# Patient Record
Sex: Female | Born: 1960 | Race: White | Hispanic: No | Marital: Single | Smoking: Current every day smoker
Health system: Southern US, Community
[De-identification: ages and names within clinical notes are randomized; demographics above are authoritative.]

## PROBLEM LIST (undated history)

## (undated) DIAGNOSIS — E039 Hypothyroidism, unspecified: Secondary | ICD-10-CM

## (undated) DIAGNOSIS — G479 Sleep disorder, unspecified: Secondary | ICD-10-CM

## (undated) DIAGNOSIS — M797 Fibromyalgia: Secondary | ICD-10-CM

## (undated) DIAGNOSIS — I700 Atherosclerosis of aorta: Secondary | ICD-10-CM

## (undated) DIAGNOSIS — M8580 Other specified disorders of bone density and structure, unspecified site: Secondary | ICD-10-CM

## (undated) DIAGNOSIS — J449 Chronic obstructive pulmonary disease, unspecified: Secondary | ICD-10-CM

## (undated) HISTORY — DX: Chronic obstructive pulmonary disease, unspecified: J44.9

## (undated) HISTORY — DX: Fibromyalgia: M79.7

## (undated) HISTORY — DX: Atherosclerosis of aorta: I70.0

## (undated) HISTORY — DX: Hypothyroidism, unspecified: E03.9

## (undated) HISTORY — DX: Sleep disorder, unspecified: G47.9

## (undated) HISTORY — DX: Other specified disorders of bone density and structure, unspecified site: M85.80

---

## 1979-08-03 HISTORY — PX: LAPAROSCOPY: SHX197

## 1990-12-02 HISTORY — PX: THYROIDECTOMY, PARTIAL: SHX18

## 2002-05-24 ENCOUNTER — Other Ambulatory Visit: Admission: RE | Admit: 2002-05-24 | Discharge: 2002-05-24 | Payer: Self-pay | Admitting: Obstetrics & Gynecology

## 2003-07-25 ENCOUNTER — Other Ambulatory Visit: Admission: RE | Admit: 2003-07-25 | Discharge: 2003-07-25 | Payer: Self-pay | Admitting: Obstetrics & Gynecology

## 2004-09-17 ENCOUNTER — Other Ambulatory Visit: Admission: RE | Admit: 2004-09-17 | Discharge: 2004-09-17 | Payer: Self-pay | Admitting: Obstetrics & Gynecology

## 2005-02-12 ENCOUNTER — Ambulatory Visit: Payer: Self-pay | Admitting: Internal Medicine

## 2005-04-02 ENCOUNTER — Ambulatory Visit: Payer: Self-pay | Admitting: Internal Medicine

## 2005-07-01 ENCOUNTER — Ambulatory Visit: Payer: Self-pay | Admitting: Internal Medicine

## 2005-07-22 ENCOUNTER — Ambulatory Visit: Payer: Self-pay | Admitting: Internal Medicine

## 2005-10-08 ENCOUNTER — Ambulatory Visit: Payer: Self-pay | Admitting: Internal Medicine

## 2005-12-11 ENCOUNTER — Ambulatory Visit: Payer: Self-pay | Admitting: Internal Medicine

## 2006-02-27 ENCOUNTER — Ambulatory Visit: Payer: Self-pay | Admitting: Internal Medicine

## 2006-08-07 ENCOUNTER — Ambulatory Visit: Payer: Self-pay | Admitting: Internal Medicine

## 2007-02-05 ENCOUNTER — Ambulatory Visit: Payer: Self-pay | Admitting: Internal Medicine

## 2007-05-27 ENCOUNTER — Encounter: Admission: RE | Admit: 2007-05-27 | Discharge: 2007-05-27 | Payer: Self-pay | Admitting: Obstetrics & Gynecology

## 2007-08-05 DIAGNOSIS — G479 Sleep disorder, unspecified: Secondary | ICD-10-CM | POA: Insufficient documentation

## 2007-08-05 DIAGNOSIS — J309 Allergic rhinitis, unspecified: Secondary | ICD-10-CM | POA: Insufficient documentation

## 2007-08-05 DIAGNOSIS — N809 Endometriosis, unspecified: Secondary | ICD-10-CM | POA: Insufficient documentation

## 2007-08-05 DIAGNOSIS — M797 Fibromyalgia: Secondary | ICD-10-CM | POA: Insufficient documentation

## 2007-08-05 DIAGNOSIS — E039 Hypothyroidism, unspecified: Secondary | ICD-10-CM

## 2007-08-17 ENCOUNTER — Ambulatory Visit: Payer: Self-pay | Admitting: Internal Medicine

## 2007-08-17 LAB — CONVERTED CEMR LAB
Blood in Urine, dipstick: NEGATIVE
Glucose, Urine, Semiquant: NEGATIVE
Ketones, urine, test strip: NEGATIVE
Nitrite: NEGATIVE
Protein, U semiquant: NEGATIVE
WBC Urine, dipstick: NEGATIVE

## 2007-08-19 LAB — CONVERTED CEMR LAB
Albumin: 4 g/dL (ref 3.5–5.2)
BUN: 8 mg/dL (ref 6–23)
Calcium: 9.1 mg/dL (ref 8.4–10.5)
Chloride: 109 meq/L (ref 96–112)
Creatinine, Ser: 0.6 mg/dL (ref 0.4–1.2)
Free T4: 0.9 ng/dL (ref 0.6–1.6)
GFR calc Af Amer: 138 mL/min
GFR calc non Af Amer: 114 mL/min
Sodium: 144 meq/L (ref 135–145)
TSH: 0.74 microintl units/mL (ref 0.35–5.50)

## 2007-11-10 ENCOUNTER — Ambulatory Visit: Payer: Self-pay | Admitting: Internal Medicine

## 2007-11-24 ENCOUNTER — Telehealth (INDEPENDENT_AMBULATORY_CARE_PROVIDER_SITE_OTHER): Payer: Self-pay | Admitting: *Deleted

## 2007-12-17 ENCOUNTER — Ambulatory Visit: Payer: Self-pay | Admitting: Family Medicine

## 2007-12-17 LAB — CONVERTED CEMR LAB
Ketones, urine, test strip: NEGATIVE
Nitrite: NEGATIVE
Specific Gravity, Urine: 1.01
Urobilinogen, UA: NEGATIVE
WBC Urine, dipstick: NEGATIVE
pH: 6.5

## 2008-02-12 ENCOUNTER — Ambulatory Visit: Payer: Self-pay | Admitting: Internal Medicine

## 2008-04-28 ENCOUNTER — Telehealth: Payer: Self-pay | Admitting: Internal Medicine

## 2008-05-17 ENCOUNTER — Ambulatory Visit: Payer: Self-pay | Admitting: Internal Medicine

## 2008-05-17 LAB — CONVERTED CEMR LAB
Bilirubin Urine: NEGATIVE
Specific Gravity, Urine: <1.005
Urobilinogen, UA: 0.2
WBC, UA: 0 cells/hpf

## 2008-08-22 ENCOUNTER — Ambulatory Visit: Payer: Self-pay | Admitting: Internal Medicine

## 2008-08-22 DIAGNOSIS — M899 Disorder of bone, unspecified: Secondary | ICD-10-CM | POA: Insufficient documentation

## 2008-08-22 DIAGNOSIS — M949 Disorder of cartilage, unspecified: Secondary | ICD-10-CM

## 2008-08-24 LAB — CONVERTED CEMR LAB
Albumin: 4.1 g/dL (ref 3.5–5.2)
BUN: 11 mg/dL (ref 6–23)
CO2: 28 meq/L (ref 19–32)
Free T4: 1 ng/dL (ref 0.6–1.6)
GFR calc non Af Amer: 113 mL/min
Glucose, Bld: 72 mg/dL (ref 70–99)
Potassium: 3.8 meq/L (ref 3.5–5.1)
Sodium: 141 meq/L (ref 135–145)

## 2008-08-29 ENCOUNTER — Telehealth: Payer: Self-pay | Admitting: Internal Medicine

## 2008-12-29 ENCOUNTER — Telehealth: Payer: Self-pay | Admitting: Internal Medicine

## 2009-02-23 ENCOUNTER — Ambulatory Visit: Payer: Self-pay | Admitting: Internal Medicine

## 2009-04-11 ENCOUNTER — Telehealth: Payer: Self-pay | Admitting: Internal Medicine

## 2009-04-11 ENCOUNTER — Encounter: Payer: Self-pay | Admitting: Internal Medicine

## 2009-04-11 ENCOUNTER — Emergency Department (HOSPITAL_COMMUNITY): Admission: EM | Admit: 2009-04-11 | Discharge: 2009-04-11 | Payer: Self-pay | Admitting: Family Medicine

## 2009-08-25 ENCOUNTER — Ambulatory Visit: Payer: Self-pay | Admitting: Internal Medicine

## 2010-02-26 ENCOUNTER — Ambulatory Visit: Payer: Self-pay | Admitting: Internal Medicine

## 2010-03-19 ENCOUNTER — Telehealth: Payer: Self-pay | Admitting: Internal Medicine

## 2010-03-19 ENCOUNTER — Ambulatory Visit: Payer: Self-pay | Admitting: Internal Medicine

## 2010-04-18 ENCOUNTER — Ambulatory Visit: Payer: Self-pay | Admitting: Internal Medicine

## 2010-04-18 DIAGNOSIS — G44209 Tension-type headache, unspecified, not intractable: Secondary | ICD-10-CM | POA: Insufficient documentation

## 2010-05-24 ENCOUNTER — Ambulatory Visit: Payer: Self-pay | Admitting: Internal Medicine

## 2010-08-23 ENCOUNTER — Ambulatory Visit: Payer: Self-pay | Admitting: Internal Medicine

## 2010-12-23 ENCOUNTER — Encounter: Payer: Self-pay | Admitting: Obstetrics & Gynecology

## 2010-12-30 LAB — CONVERTED CEMR LAB
ALT: 16 units/L (ref 0–35)
Alkaline Phosphatase: 48 units/L (ref 39–117)
Basophils Relative: 0 % (ref 0.0–3.0)
Bilirubin, Direct: 0.1 mg/dL (ref 0.0–0.3)
Eosinophils Absolute: 0.2 10*3/uL (ref 0.0–0.7)
Eosinophils Relative: 2.2 % (ref 0.0–5.0)
GFR calc non Af Amer: 112.25 mL/min (ref >60)
Glucose, Bld: 83 mg/dL (ref 70–99)
Lymphocytes Relative: 31.9 % (ref 12.0–46.0)
Monocytes Relative: 4.6 % (ref 3.0–12.0)
Neutrophils Relative %: 61.3 % (ref 43.0–77.0)
Phosphorus: 4.4 mg/dL (ref 2.3–4.6)
Potassium: 4.2 meq/L (ref 3.5–5.1)
RBC: 4.58 M/uL (ref 3.87–5.11)
Sodium: 142 meq/L (ref 135–145)
Total Protein: 6.8 g/dL (ref 6.0–8.3)
WBC: 7.6 10*3/uL (ref 4.5–10.5)

## 2011-01-01 NOTE — Progress Notes (Signed)
  Phone Note From Other Clinic   Caller: [facility] Call For: [facility] Summary of Call: ifob test never received, test canceled. Initial call taken by: Mills Koller,  March 19, 2010 3:00 PM  Follow-up for Phone Call        noted Follow-up by: Cindee Salt MD,  March 19, 2010 5:14 PM
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
## 2011-01-01 NOTE — Letter (Signed)
Summary: [facility]: Immunoassay Fecal Occult Blood (iFOB) Order Form  [place] [facility]  [address] [place], [place] [address]   Phone: [phone]  Fax: [phone]      [facility]: Immunoassay Fecal Occult Blood (iFOB) Order Form   February 26, 2010 MRN: [id number]   ORELIA BRANDSTETTER [birth year]   Physicican Name:_______[place]__________________  Diagnosis Code:________V76.51__________________      Cindee Salt MD
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
## 2011-01-10 ENCOUNTER — Encounter: Payer: Self-pay | Admitting: Internal Medicine

## 2011-02-28 ENCOUNTER — Ambulatory Visit (INDEPENDENT_AMBULATORY_CARE_PROVIDER_SITE_OTHER): Payer: PRIVATE HEALTH INSURANCE | Admitting: Internal Medicine

## 2011-02-28 ENCOUNTER — Other Ambulatory Visit: Payer: Self-pay | Admitting: Internal Medicine

## 2011-02-28 ENCOUNTER — Encounter: Payer: Self-pay | Admitting: Internal Medicine

## 2011-02-28 VITALS — BP 122/73 | HR 88 | Temp 98.6°F | Wt 117.0 lb

## 2011-02-28 DIAGNOSIS — IMO0001 Reserved for inherently not codable concepts without codable children: Secondary | ICD-10-CM

## 2011-02-28 DIAGNOSIS — Z1211 Encounter for screening for malignant neoplasm of colon: Secondary | ICD-10-CM

## 2011-02-28 DIAGNOSIS — Z0000 Encounter for general adult medical examination without abnormal findings: Secondary | ICD-10-CM

## 2011-02-28 DIAGNOSIS — G44209 Tension-type headache, unspecified, not intractable: Secondary | ICD-10-CM

## 2011-02-28 DIAGNOSIS — E039 Hypothyroidism, unspecified: Secondary | ICD-10-CM

## 2011-02-28 DIAGNOSIS — G479 Sleep disorder, unspecified: Secondary | ICD-10-CM

## 2011-02-28 NOTE — Progress Notes (Signed)
Addended by: Tillman Abide on: 02/28/2011 04:15 PM   Modules accepted: Orders

## 2011-02-28 NOTE — Progress Notes (Signed)
Addended byMills Koller on: 02/28/2011 04:30 PM   Modules accepted: Orders
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
## 2011-03-01 LAB — CBC WITH DIFFERENTIAL/PLATELET
Basophils Relative: 0.6 % (ref 0.0–3.0)
Eosinophils Absolute: 0.2 10*3/uL (ref 0.0–0.7)
Hemoglobin: 14.3 g/dL (ref 12.0–15.0)
Lymphocytes Relative: 38.3 % (ref 12.0–46.0)
MCHC: 35.1 g/dL (ref 30.0–36.0)
Neutro Abs: 3.2 10*3/uL (ref 1.4–7.7)
RBC: 4.39 Mil/uL (ref 3.87–5.11)

## 2011-03-01 LAB — TSH: TSH: 0.7 u[IU]/mL (ref 0.35–5.50)

## 2011-03-04 ENCOUNTER — Telehealth: Payer: Self-pay

## 2011-03-04 NOTE — Telephone Encounter (Signed)
Message copied by Kyung Rudd on Mon Mar 04, 2011  5:11 PM ------      Message from: Tillman Abide      Created: Fri Mar 01, 2011  3:30 PM       Blood work is fine      Blood count, blood sugar and thyroid are all normal

## 2011-04-08 ENCOUNTER — Other Ambulatory Visit: Payer: Self-pay | Admitting: *Deleted

## 2011-04-08 MED ORDER — SUMATRIPTAN SUCCINATE 50 MG PO TABS: ORAL_TABLET | ORAL | Status: DC

## 2011-04-09 ENCOUNTER — Other Ambulatory Visit: Payer: PRIVATE HEALTH INSURANCE

## 2011-04-16 ENCOUNTER — Telehealth: Payer: Self-pay | Admitting: Radiology

## 2011-04-16 NOTE — Telephone Encounter (Signed)
FYI patient never returned ifob, LMOM with no response.

## 2011-04-17 NOTE — Telephone Encounter (Signed)
Okay Will review at next visit

## 2011-05-31 ENCOUNTER — Other Ambulatory Visit: Payer: Self-pay | Admitting: *Deleted

## 2011-05-31 MED ORDER — SUMATRIPTAN SUCCINATE 50 MG PO TABS: ORAL_TABLET | ORAL | Status: DC

## 2011-05-31 NOTE — Telephone Encounter (Signed)
Faxed request from [facility] is on your desk.

## 2011-09-02 ENCOUNTER — Encounter: Payer: Self-pay | Admitting: Internal Medicine

## 2011-09-02 ENCOUNTER — Ambulatory Visit (INDEPENDENT_AMBULATORY_CARE_PROVIDER_SITE_OTHER): Payer: PRIVATE HEALTH INSURANCE | Admitting: Internal Medicine

## 2011-09-02 VITALS — BP 128/80 | HR 96 | Temp 98.1°F | Ht 65.0 in | Wt 120.0 lb

## 2011-09-02 DIAGNOSIS — G44209 Tension-type headache, unspecified, not intractable: Secondary | ICD-10-CM

## 2011-09-02 DIAGNOSIS — J019 Acute sinusitis, unspecified: Secondary | ICD-10-CM | POA: Insufficient documentation

## 2011-09-02 DIAGNOSIS — IMO0001 Reserved for inherently not codable concepts without codable children: Secondary | ICD-10-CM

## 2011-09-02 MED ORDER — FLUCONAZOLE 150 MG PO TABS: 150.0000 mg | ORAL_TABLET | Freq: Once | ORAL | Status: AC

## 2011-09-02 MED ORDER — AZITHROMYCIN 250 MG PO TABS: ORAL_TABLET | ORAL | Status: AC

## 2011-09-02 MED ORDER — SUMATRIPTAN SUCCINATE 50 MG PO TABS: ORAL_TABLET | ORAL | Status: DC

## 2011-09-02 NOTE — Assessment & Plan Note (Signed)
Probably mixed migraine and tension Clearly worse in allergy symptoms

## 2011-09-02 NOTE — Assessment & Plan Note (Signed)
Some better but persistent symptoms No changes
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
## 2011-09-02 NOTE — Assessment & Plan Note (Signed)
Ongoing symptoms for well more than a week though not much cough Ear pressure Will Rx with z-pak

## 2012-03-02 ENCOUNTER — Encounter: Payer: Self-pay | Admitting: Internal Medicine

## 2012-03-02 ENCOUNTER — Ambulatory Visit (INDEPENDENT_AMBULATORY_CARE_PROVIDER_SITE_OTHER): Payer: PRIVATE HEALTH INSURANCE | Admitting: Internal Medicine

## 2012-03-02 VITALS — BP 126/82 | HR 94 | Temp 97.9°F | Ht 65.0 in | Wt 121.0 lb

## 2012-03-02 DIAGNOSIS — IMO0001 Reserved for inherently not codable concepts without codable children: Secondary | ICD-10-CM

## 2012-03-02 DIAGNOSIS — Z1211 Encounter for screening for malignant neoplasm of colon: Secondary | ICD-10-CM

## 2012-03-02 DIAGNOSIS — Z0000 Encounter for general adult medical examination without abnormal findings: Secondary | ICD-10-CM

## 2012-03-02 DIAGNOSIS — J309 Allergic rhinitis, unspecified: Secondary | ICD-10-CM

## 2012-03-02 DIAGNOSIS — E039 Hypothyroidism, unspecified: Secondary | ICD-10-CM

## 2012-03-02 MED ORDER — MONTELUKAST SODIUM 10 MG PO TABS: 10.0000 mg | ORAL_TABLET | Freq: Every day | ORAL | Status: DC

## 2012-03-02 MED ORDER — CYCLOBENZAPRINE HCL 10 MG PO TABS: 10.0000 mg | ORAL_TABLET | Freq: Every evening | ORAL | Status: DC | PRN

## 2012-03-02 MED ORDER — LEVOTHYROXINE SODIUM 112 MCG PO TABS: 112.0000 ug | ORAL_TABLET | Freq: Every day | ORAL | Status: DC

## 2012-03-02 MED ORDER — AMITRIPTYLINE HCL 10 MG PO TABS: 10.0000 mg | ORAL_TABLET | Freq: Every day | ORAL | Status: DC

## 2012-03-02 NOTE — Assessment & Plan Note (Signed)
Needs labs

## 2012-03-02 NOTE — Assessment & Plan Note (Signed)
Really worse Can't tolerate antihistamines  flonase didn't work Will try montelukast Consider prednisone for her ear pain if that doesn't improve

## 2012-03-02 NOTE — Assessment & Plan Note (Signed)
Reasonable control on her regimen

## 2012-03-02 NOTE — Assessment & Plan Note (Signed)
Generally healthy Agrees to do stool immunoassay
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
## 2012-03-03 ENCOUNTER — Encounter: Payer: Self-pay | Admitting: *Deleted

## 2012-03-03 LAB — HEPATIC FUNCTION PANEL
ALT: 15 U/L (ref 0–35)
AST: 23 U/L (ref 0–37)
Bilirubin, Direct: 0.1 mg/dL (ref 0.0–0.3)
Total Bilirubin: 0.2 mg/dL — ABNORMAL LOW (ref 0.3–1.2)

## 2012-03-03 LAB — CBC WITH DIFFERENTIAL/PLATELET
Basophils Relative: 0.4 % (ref 0.0–3.0)
Eosinophils Relative: 2.8 % (ref 0.0–5.0)
Lymphocytes Relative: 40.6 % (ref 12.0–46.0)
Neutrophils Relative %: 49 % (ref 43.0–77.0)
RBC: 4.5 Mil/uL (ref 3.87–5.11)
WBC: 6 10*3/uL (ref 4.5–10.5)

## 2012-03-03 LAB — LIPID PANEL
HDL: 57.3 mg/dL (ref >39.00)
LDL Cholesterol: 119 mg/dL — ABNORMAL HIGH (ref 0–99)
Total CHOL/HDL Ratio: 3
Triglycerides: 93 mg/dL (ref 0.0–149.0)
VLDL: 18.6 mg/dL (ref 0.0–40.0)

## 2012-03-03 LAB — BASIC METABOLIC PANEL
Calcium: 9.2 mg/dL (ref 8.4–10.5)
Chloride: 105 mEq/L (ref 96–112)
Creatinine, Ser: 0.7 mg/dL (ref 0.4–1.2)
GFR: 94.77 mL/min (ref >60.00)

## 2012-05-25 ENCOUNTER — Ambulatory Visit: Payer: PRIVATE HEALTH INSURANCE | Admitting: Internal Medicine

## 2012-05-28 ENCOUNTER — Other Ambulatory Visit: Payer: Self-pay | Admitting: Internal Medicine

## 2012-05-28 NOTE — Telephone Encounter (Signed)
Okay #60 x 3 

## 2012-05-28 NOTE — Telephone Encounter (Signed)
Ok to fill 

## 2012-05-28 NOTE — Telephone Encounter (Signed)
rx sent to pharmacy by e-script  

## 2012-05-29 ENCOUNTER — Encounter: Payer: Self-pay | Admitting: Internal Medicine

## 2012-05-29 ENCOUNTER — Ambulatory Visit (INDEPENDENT_AMBULATORY_CARE_PROVIDER_SITE_OTHER): Payer: PRIVATE HEALTH INSURANCE | Admitting: Internal Medicine

## 2012-05-29 VITALS — BP 120/80 | HR 100 | Temp 97.5°F | Ht 65.0 in | Wt 122.0 lb

## 2012-05-29 DIAGNOSIS — J069 Acute upper respiratory infection, unspecified: Secondary | ICD-10-CM

## 2012-05-29 DIAGNOSIS — R51 Headache: Secondary | ICD-10-CM

## 2012-05-29 MED ORDER — CLINDAMYCIN HCL 300 MG PO CAPS: 300.0000 mg | ORAL_CAPSULE | Freq: Three times a day (TID) | ORAL | Status: AC

## 2012-05-29 NOTE — Assessment & Plan Note (Signed)
Ongoing symptoms Seems to be mixed with both tension and migraine components Related to fibromyalgia as well Discussed stress component but she denies this entirely Not regular with elavil (or only takes 1) Intolerant of pain meds  Will refer to pain specialist
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
## 2012-05-29 NOTE — Assessment & Plan Note (Signed)
Laryngeal inflammation She feels it in chest Convinced that she needs antibiotic (I am not as sure) Very upset today  Will treat

## 2012-06-13 ENCOUNTER — Other Ambulatory Visit: Payer: Self-pay | Admitting: Internal Medicine

## 2012-08-24 ENCOUNTER — Ambulatory Visit (INDEPENDENT_AMBULATORY_CARE_PROVIDER_SITE_OTHER): Payer: PRIVATE HEALTH INSURANCE | Admitting: Internal Medicine

## 2012-08-24 ENCOUNTER — Encounter: Payer: Self-pay | Admitting: Internal Medicine

## 2012-08-24 VITALS — HR 76 | Temp 98.2°F | Wt 120.0 lb

## 2012-08-24 DIAGNOSIS — N390 Urinary tract infection, site not specified: Secondary | ICD-10-CM | POA: Insufficient documentation

## 2012-08-24 DIAGNOSIS — R300 Dysuria: Secondary | ICD-10-CM

## 2012-08-24 LAB — POCT URINALYSIS DIPSTICK
Bilirubin, UA: NEGATIVE
Glucose, UA: NEGATIVE
Nitrite, UA: NEGATIVE
Urobilinogen, UA: NEGATIVE

## 2012-08-24 MED ORDER — CIPROFLOXACIN HCL 250 MG PO TABS: 250.0000 mg | ORAL_TABLET | Freq: Two times a day (BID) | ORAL | Status: DC

## 2012-08-24 NOTE — Assessment & Plan Note (Signed)
Classic symptoms Has had infections in past but none recently Will treat empirically
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
## 2012-09-03 ENCOUNTER — Telehealth: Payer: Self-pay | Admitting: Internal Medicine

## 2012-09-03 ENCOUNTER — Other Ambulatory Visit: Payer: Self-pay | Admitting: *Deleted

## 2012-09-03 MED ORDER — SULFAMETHOXAZOLE-TRIMETHOPRIM 800-160 MG PO TABS: 1.0000 | ORAL_TABLET | Freq: Two times a day (BID) | ORAL | Status: DC

## 2012-09-03 NOTE — Telephone Encounter (Signed)
Okay to change to generic of septra DS 1 tab bid #6 x 0

## 2012-09-03 NOTE — Telephone Encounter (Signed)
Caller: Jerra/Patient; Patient Name: Shari Graham; PCP: Tillman Abide [facility]); Best Callback Phone Number: [phone] Onset-08/24/12    Afebrile.  Pt states she has been taking Cipro for bladder infection and symptoms have gotten worse. She c/o of dysuria, frequency, hesistancy. Emergent s/s of urinary symptoms protocol r/o. See provider within 24hrs. Pt states she can not come in today and she is leaving town early in the morning. She is requesting a message to provider to switch antibiotic.   Pharmacy is [facility] in [place]. PLEASE CALL.

## 2012-09-03 NOTE — Telephone Encounter (Signed)
LMOVM of contact number.  Medication sent to designated pharmacy.

## 2012-09-28 ENCOUNTER — Other Ambulatory Visit: Payer: Self-pay | Admitting: Internal Medicine

## 2012-12-29 ENCOUNTER — Other Ambulatory Visit: Payer: Self-pay | Admitting: Internal Medicine

## 2012-12-29 NOTE — Telephone Encounter (Signed)
Ok to fill 

## 2012-12-30 NOTE — Telephone Encounter (Signed)
Okay #60 x 1 

## 2012-12-30 NOTE — Telephone Encounter (Signed)
rx called into pharmacy

## 2013-02-26 ENCOUNTER — Other Ambulatory Visit: Payer: Self-pay | Admitting: Internal Medicine

## 2013-03-04 ENCOUNTER — Encounter: Payer: Self-pay | Admitting: Internal Medicine

## 2013-03-04 ENCOUNTER — Ambulatory Visit (INDEPENDENT_AMBULATORY_CARE_PROVIDER_SITE_OTHER): Payer: PRIVATE HEALTH INSURANCE | Admitting: Internal Medicine

## 2013-03-04 VITALS — BP 118/80 | HR 82 | Temp 97.7°F | Wt 119.0 lb

## 2013-03-04 DIAGNOSIS — IMO0001 Reserved for inherently not codable concepts without codable children: Secondary | ICD-10-CM

## 2013-03-04 DIAGNOSIS — E039 Hypothyroidism, unspecified: Secondary | ICD-10-CM

## 2013-03-04 DIAGNOSIS — R51 Headache: Secondary | ICD-10-CM

## 2013-03-04 DIAGNOSIS — Z1211 Encounter for screening for malignant neoplasm of colon: Secondary | ICD-10-CM

## 2013-03-04 DIAGNOSIS — Z0000 Encounter for general adult medical examination without abnormal findings: Secondary | ICD-10-CM

## 2013-03-04 MED ORDER — LEVOTHYROXINE SODIUM 112 MCG PO TABS: 112.0000 ug | ORAL_TABLET | Freq: Every day | ORAL | Status: DC

## 2013-03-04 MED ORDER — MONTELUKAST SODIUM 10 MG PO TABS: 10.0000 mg | ORAL_TABLET | Freq: Every day | ORAL | Status: DC

## 2013-03-04 NOTE — Assessment & Plan Note (Signed)
Due for gyn exam Urged her to do stool immunoassay this year (never does it) Won't stop smoking

## 2013-03-04 NOTE — Assessment & Plan Note (Signed)
Pain better Still sleep issues

## 2013-03-04 NOTE — Assessment & Plan Note (Signed)
Better with Dr Vela Prose

## 2013-03-04 NOTE — Assessment & Plan Note (Signed)
Due for labs

## 2013-03-04 NOTE — Addendum Note (Signed)
Addended by: Sueanne Margarita on: 03/04/2013 04:22 PM   Modules accepted: Orders
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
## 2013-03-05 ENCOUNTER — Encounter: Payer: Self-pay | Admitting: *Deleted

## 2013-03-05 ENCOUNTER — Telehealth: Payer: Self-pay | Admitting: *Deleted

## 2013-03-05 LAB — CBC WITH DIFFERENTIAL/PLATELET
Eosinophils Absolute: 0.2 10*3/uL (ref 0.0–0.7)
HCT: 40.1 % (ref 36.0–46.0)
Lymphs Abs: 3 10*3/uL (ref 0.7–4.0)
MCHC: 34.2 g/dL (ref 30.0–36.0)
MCV: 88.8 fl (ref 78.0–100.0)
Monocytes Absolute: 0.5 10*3/uL (ref 0.1–1.0)
Neutrophils Relative %: 48.1 % (ref 43.0–77.0)
Platelets: 193 10*3/uL (ref 150.0–400.0)
RDW: 11.4 % — ABNORMAL LOW (ref 11.5–14.6)

## 2013-03-05 LAB — HEPATIC FUNCTION PANEL
Albumin: 4.2 g/dL (ref 3.5–5.2)
Total Bilirubin: 0.2 mg/dL — ABNORMAL LOW (ref 0.3–1.2)

## 2013-03-05 LAB — TSH: TSH: 0.1 u[IU]/mL — ABNORMAL LOW (ref 0.35–5.50)

## 2013-03-05 LAB — BASIC METABOLIC PANEL
BUN: 16 mg/dL (ref 6–23)
CO2: 25 mEq/L (ref 19–32)
Chloride: 108 mEq/L (ref 96–112)
Creatinine, Ser: 0.7 mg/dL (ref 0.4–1.2)
Glucose, Bld: 89 mg/dL (ref 70–99)

## 2013-03-05 MED ORDER — LEVOTHYROXINE SODIUM 100 MCG PO TABS: 100.0000 ug | ORAL_TABLET | Freq: Every day | ORAL | Status: DC

## 2013-03-05 MED ORDER — AZITHROMYCIN 250 MG PO TABS: ORAL_TABLET | ORAL | Status: DC

## 2013-03-05 NOTE — Telephone Encounter (Signed)
Message copied by Sueanne Margarita on Fri Mar 05, 2013  2:29 PM ------      Message from: Tillman Abide I      Created: Fri Mar 05, 2013  1:55 PM       Please call       The thyroid tests suggest that she may be on a little more levothyroxine than she needs. Have her decrease to daily. Set up labs for about 6 weeks after she changes over (Free T4 and TSH)      Minor abnormalities in bilirubin and RDW (on blood count) are both normal variants      Everything else is normal      Send copy if she wishes ------

## 2013-03-05 NOTE — Telephone Encounter (Signed)
Spoke with patient and advised results letter mailed to patients home address with results. rx sent to pharmacy by e-script   Also pt states she was supposed to get a rx for her ears? Pt states she forgot to ask, but she did mention it, please advise

## 2013-03-05 NOTE — Telephone Encounter (Signed)
Yes we did but I forgot  She needs a z-pak sent to her pharmacy

## 2013-03-29 ENCOUNTER — Other Ambulatory Visit: Payer: Self-pay | Admitting: Internal Medicine

## 2013-04-30 ENCOUNTER — Other Ambulatory Visit: Payer: Self-pay | Admitting: Internal Medicine

## 2013-06-26 ENCOUNTER — Other Ambulatory Visit: Payer: Self-pay | Admitting: Internal Medicine

## 2013-06-28 NOTE — Telephone Encounter (Signed)
Okay #60 x 1 

## 2013-06-28 NOTE — Telephone Encounter (Signed)
rx sent to pharmacy by e-script  

## 2013-07-08 ENCOUNTER — Encounter: Payer: Self-pay | Admitting: Family Medicine

## 2013-07-08 ENCOUNTER — Ambulatory Visit (INDEPENDENT_AMBULATORY_CARE_PROVIDER_SITE_OTHER): Payer: PRIVATE HEALTH INSURANCE | Admitting: Family Medicine

## 2013-07-08 VITALS — BP 130/84 | HR 80 | Temp 98.2°F | Wt 116.5 lb

## 2013-07-08 DIAGNOSIS — R300 Dysuria: Secondary | ICD-10-CM

## 2013-07-08 LAB — POCT URINALYSIS DIPSTICK
Bilirubin, UA: NEGATIVE
Ketones, UA: NEGATIVE
Protein, UA: NEGATIVE
Spec Grav, UA: <=1.005

## 2013-07-08 MED ORDER — CIPROFLOXACIN HCL 250 MG PO TABS: 250.0000 mg | ORAL_TABLET | Freq: Two times a day (BID) | ORAL | Status: DC

## 2013-07-08 NOTE — Patient Instructions (Addendum)
Urine overall looking ok.  I have sent culture and have sent antibiotic course to your pharmacy. Let us know if not improving with treatment. Continue to push fluids and rest.
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
## 2013-07-08 NOTE — Assessment & Plan Note (Signed)
UA/micro overall reassuring, however given typical sxs I will treat with short abx course.  UCx sent.

## 2013-07-10 LAB — URINE CULTURE: Colony Count: NO GROWTH

## 2013-08-29 ENCOUNTER — Other Ambulatory Visit: Payer: Self-pay | Admitting: Internal Medicine

## 2013-11-01 ENCOUNTER — Other Ambulatory Visit: Payer: Self-pay | Admitting: Internal Medicine

## 2014-01-01 ENCOUNTER — Other Ambulatory Visit: Payer: Self-pay | Admitting: Internal Medicine

## 2014-02-24 ENCOUNTER — Ambulatory Visit: Payer: PRIVATE HEALTH INSURANCE | Admitting: Internal Medicine

## 2014-02-26 ENCOUNTER — Other Ambulatory Visit: Payer: Self-pay | Admitting: Internal Medicine

## 2014-03-07 ENCOUNTER — Ambulatory Visit (INDEPENDENT_AMBULATORY_CARE_PROVIDER_SITE_OTHER): Payer: PRIVATE HEALTH INSURANCE | Admitting: Internal Medicine

## 2014-03-07 ENCOUNTER — Encounter: Payer: Self-pay | Admitting: Internal Medicine

## 2014-03-07 VITALS — BP 118/80 | HR 97 | Temp 98.5°F | Ht 65.0 in | Wt 118.0 lb

## 2014-03-07 DIAGNOSIS — R51 Headache: Secondary | ICD-10-CM

## 2014-03-07 DIAGNOSIS — Z1211 Encounter for screening for malignant neoplasm of colon: Secondary | ICD-10-CM

## 2014-03-07 DIAGNOSIS — G8929 Other chronic pain: Secondary | ICD-10-CM

## 2014-03-07 DIAGNOSIS — Z0000 Encounter for general adult medical examination without abnormal findings: Secondary | ICD-10-CM

## 2014-03-07 DIAGNOSIS — IMO0001 Reserved for inherently not codable concepts without codable children: Secondary | ICD-10-CM

## 2014-03-07 DIAGNOSIS — E039 Hypothyroidism, unspecified: Secondary | ICD-10-CM

## 2014-03-07 DIAGNOSIS — M797 Fibromyalgia: Secondary | ICD-10-CM

## 2014-03-07 MED ORDER — CYCLOBENZAPRINE HCL 10 MG PO TABS: 10.0000 mg | ORAL_TABLET | Freq: Every evening | ORAL | Status: DC | PRN

## 2014-03-07 MED ORDER — AMITRIPTYLINE HCL 10 MG PO TABS: 10.0000 mg | ORAL_TABLET | Freq: Every day | ORAL | Status: DC

## 2014-03-07 MED ORDER — MONTELUKAST SODIUM 10 MG PO TABS: 10.0000 mg | ORAL_TABLET | Freq: Every day | ORAL | Status: DC

## 2014-03-07 MED ORDER — NYSTATIN 100000 UNIT/ML MT SUSP
5.0000 mL | Freq: Four times a day (QID) | OROMUCOSAL | Status: DC
Start: 2014-03-07 — End: 2014-06-12
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
## 2014-03-07 NOTE — Assessment & Plan Note (Signed)
Seems euthyroid ?Will check labs ?

## 2014-03-07 NOTE — Assessment & Plan Note (Addendum)
Not ready to stop smoking Discussed colon cancer screening---never does the fecal tests. Told me to try again Due to see Dr Neal--gyn

## 2014-03-07 NOTE — Assessment & Plan Note (Signed)
Controlled on meds Discussed that she needs to be regular with her exercise

## 2014-03-07 NOTE — Progress Notes (Signed)
Pre visit review using our clinic review tool, if applicable. No additional management support is needed unless otherwise documented below in the visit note. 

## 2014-03-07 NOTE — Assessment & Plan Note (Signed)
Better with the topirimate

## 2014-03-07 NOTE — Patient Instructions (Signed)
Please do the fecal test right away---so you don't forget it. Please set up your appointment with Dr Jennette KettleNeal (and have him send me his report)

## 2014-03-08 ENCOUNTER — Telehealth: Payer: Self-pay | Admitting: Internal Medicine

## 2014-03-08 ENCOUNTER — Encounter: Payer: Self-pay | Admitting: *Deleted

## 2014-03-08 LAB — COMPREHENSIVE METABOLIC PANEL
ALT: 13 U/L (ref 0–35)
AST: 20 U/L (ref 0–37)
Albumin: 4 g/dL (ref 3.5–5.2)
Alkaline Phosphatase: 61 U/L (ref 39–117)
BUN: 17 mg/dL (ref 6–23)
CALCIUM: 9 mg/dL (ref 8.4–10.5)
CO2: 25 meq/L (ref 19–32)
CREATININE: 0.8 mg/dL (ref 0.4–1.2)
Chloride: 109 mEq/L (ref 96–112)
GFR: 85.42 mL/min (ref >60.00)
Glucose, Bld: 87 mg/dL (ref 70–99)
Potassium: 3.8 mEq/L (ref 3.5–5.1)
Sodium: 141 mEq/L (ref 135–145)
Total Bilirubin: 0.6 mg/dL (ref 0.3–1.2)
Total Protein: 6.6 g/dL (ref 6.0–8.3)

## 2014-03-08 LAB — CBC WITH DIFFERENTIAL/PLATELET
BASOS ABS: 0 10*3/uL (ref 0.0–0.1)
Basophils Relative: 0.3 % (ref 0.0–3.0)
EOS ABS: 0.2 10*3/uL (ref 0.0–0.7)
Eosinophils Relative: 2.2 % (ref 0.0–5.0)
HCT: 37.9 % (ref 36.0–46.0)
HEMOGLOBIN: 13.1 g/dL (ref 12.0–15.0)
LYMPHS PCT: 36 % (ref 12.0–46.0)
Lymphs Abs: 2.5 10*3/uL (ref 0.7–4.0)
MCHC: 34.5 g/dL (ref 30.0–36.0)
MCV: 89.8 fl (ref 78.0–100.0)
Monocytes Absolute: 0.5 10*3/uL (ref 0.1–1.0)
Monocytes Relative: 6.5 % (ref 3.0–12.0)
NEUTROS PCT: 55 % (ref 43.0–77.0)
Neutro Abs: 3.9 10*3/uL (ref 1.4–7.7)
Platelets: 193 10*3/uL (ref 150.0–400.0)
RBC: 4.22 Mil/uL (ref 3.87–5.11)
RDW: 11.8 % (ref 11.5–14.6)
WBC: 7 10*3/uL (ref 4.5–10.5)

## 2014-03-08 LAB — LIPID PANEL
CHOL/HDL RATIO: 4
CHOLESTEROL: 179 mg/dL (ref 0–200)
HDL: 50.3 mg/dL (ref >39.00)
LDL Cholesterol: 106 mg/dL — ABNORMAL HIGH (ref 0–99)
TRIGLYCERIDES: 114 mg/dL (ref 0.0–149.0)
VLDL: 22.8 mg/dL (ref 0.0–40.0)

## 2014-03-08 LAB — T4, FREE: FREE T4: 1.01 ng/dL (ref 0.60–1.60)

## 2014-03-08 LAB — TSH: TSH: 0.21 u[IU]/mL — AB (ref 0.35–5.50)

## 2014-03-08 NOTE — Telephone Encounter (Signed)
Relevant patient education assigned to patient using Emmi. ° °

## 2014-03-16 ENCOUNTER — Other Ambulatory Visit (INDEPENDENT_AMBULATORY_CARE_PROVIDER_SITE_OTHER): Payer: PRIVATE HEALTH INSURANCE

## 2014-03-16 DIAGNOSIS — Z1211 Encounter for screening for malignant neoplasm of colon: Secondary | ICD-10-CM

## 2014-03-16 LAB — FECAL OCCULT BLOOD, IMMUNOCHEMICAL: Fecal Occult Bld: NEGATIVE

## 2014-03-17 ENCOUNTER — Encounter: Payer: Self-pay | Admitting: *Deleted

## 2014-05-20 ENCOUNTER — Ambulatory Visit (INDEPENDENT_AMBULATORY_CARE_PROVIDER_SITE_OTHER): Payer: PRIVATE HEALTH INSURANCE | Admitting: Internal Medicine

## 2014-05-20 ENCOUNTER — Encounter: Payer: Self-pay | Admitting: Internal Medicine

## 2014-05-20 VITALS — BP 102/68 | HR 90 | Temp 98.1°F | Wt 114.5 lb

## 2014-05-20 DIAGNOSIS — M79609 Pain in unspecified limb: Secondary | ICD-10-CM

## 2014-05-20 DIAGNOSIS — M79604 Pain in right leg: Secondary | ICD-10-CM

## 2014-05-20 DIAGNOSIS — R202 Paresthesia of skin: Secondary | ICD-10-CM

## 2014-05-20 DIAGNOSIS — R200 Anesthesia of skin: Secondary | ICD-10-CM

## 2014-05-20 DIAGNOSIS — R209 Unspecified disturbances of skin sensation: Secondary | ICD-10-CM

## 2014-05-20 DIAGNOSIS — M79605 Pain in left leg: Principal | ICD-10-CM

## 2014-05-20 NOTE — Progress Notes (Signed)
Pre visit review using our clinic review tool, if applicable. No additional management support is needed unless otherwise documented below in the visit note. 
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
## 2014-05-20 NOTE — Patient Instructions (Addendum)

## 2014-05-25 ENCOUNTER — Encounter: Payer: Self-pay | Admitting: Internal Medicine

## 2014-05-25 ENCOUNTER — Ambulatory Visit (INDEPENDENT_AMBULATORY_CARE_PROVIDER_SITE_OTHER): Payer: PRIVATE HEALTH INSURANCE | Admitting: Internal Medicine

## 2014-05-25 VITALS — BP 118/80 | HR 91 | Temp 98.3°F | Wt 115.0 lb

## 2014-05-25 DIAGNOSIS — M79609 Pain in unspecified limb: Secondary | ICD-10-CM

## 2014-05-25 DIAGNOSIS — M79604 Pain in right leg: Secondary | ICD-10-CM | POA: Insufficient documentation

## 2014-05-25 DIAGNOSIS — M79605 Pain in left leg: Principal | ICD-10-CM

## 2014-05-25 LAB — VITAMIN B12: VITAMIN B 12: 256 pg/mL (ref 211–911)

## 2014-05-25 NOTE — Patient Instructions (Signed)
I don't think the veins are the problem--though you do have very mild superficial thrombophlebitis (inflamed veins at skin surface). Stop the aspirin for now. I am concerned about possible cervical (or lumbar) spondylosis as a cause of your pain. I think you should discuss this with Dr Vela ProseLewitt to see if further evaluation is indicated.
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
## 2014-05-25 NOTE — Progress Notes (Signed)
Pre visit review using our clinic review tool, if applicable. No additional management support is needed unless otherwise documented below in the visit note. 

## 2014-06-08 ENCOUNTER — Ambulatory Visit: Payer: PRIVATE HEALTH INSURANCE | Admitting: Internal Medicine

## 2014-06-12 ENCOUNTER — Other Ambulatory Visit: Payer: Self-pay | Admitting: Internal Medicine

## 2014-06-23 ENCOUNTER — Encounter: Payer: Self-pay | Admitting: Internal Medicine

## 2014-06-23 ENCOUNTER — Ambulatory Visit (INDEPENDENT_AMBULATORY_CARE_PROVIDER_SITE_OTHER): Payer: PRIVATE HEALTH INSURANCE | Admitting: Internal Medicine

## 2014-06-23 VITALS — BP 108/80 | HR 80 | Temp 98.0°F | Wt 115.0 lb

## 2014-06-23 DIAGNOSIS — J0100 Acute maxillary sinusitis, unspecified: Secondary | ICD-10-CM

## 2014-06-23 MED ORDER — AZITHROMYCIN 250 MG PO TABS: ORAL_TABLET | ORAL | Status: DC

## 2014-06-23 NOTE — Patient Instructions (Addendum)
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
## 2014-06-23 NOTE — Progress Notes (Signed)
Pre visit review using our clinic review tool, if applicable. No additional management support is needed unless otherwise documented below in the visit note. 

## 2014-07-07 ENCOUNTER — Other Ambulatory Visit: Payer: Self-pay | Admitting: Internal Medicine

## 2014-08-05 ENCOUNTER — Telehealth: Payer: Self-pay | Admitting: Internal Medicine

## 2014-08-05 NOTE — Telephone Encounter (Signed)
Pt is calling regarding her mother, who is here visiting. Pt states she has talked to you about this previously. Pt's mother needs a doctor while she is here visiting for the next 2 weeks. Pt's mother was recently hospitalized for dehydration and diarrhea. Pt states her mother needs a one-time follow-up hospital visit with you and she also needs PT with [facility]. Is this something you would be willing to do? Pt is aware you are out of the office til Tuesday 08/09/2014. Please advise, Thank you!

## 2014-08-06 NOTE — Telephone Encounter (Signed)
I really don't have much available in the next week unless I can get coverage for 1:30 on Thursday. Please check with Carlena Sax to see if we can have someone at least check her in then  Let Laurabelle know that the PT should have been ordered by the physicians who saw her at the hospital. I can't order the PT unless we are able to get her in for a face to face visit I may have somewhere to put her in next week---you can check

## 2014-08-09 NOTE — Telephone Encounter (Signed)
I spoke with patient's daughter and she scheduled appointment on Friday,Sept. 11th at 2:00.  Patient's daughter said the hospital did order the physical therapy and patient started it on Saturday.

## 2014-08-09 NOTE — Telephone Encounter (Signed)
Noted I have the discharge summary

## 2014-09-05 ENCOUNTER — Other Ambulatory Visit: Payer: Self-pay | Admitting: Internal Medicine

## 2014-09-05 NOTE — Telephone Encounter (Signed)
Okay #60 x 3

## 2014-09-05 NOTE — Telephone Encounter (Signed)
Electronic Rx request for Flexeril. Patient last office visit 05/20/14 and last request for Rx was 07/07/14

## 2014-09-06 NOTE — Telephone Encounter (Signed)
Rx sent to pharmacy   

## 2014-11-11 ENCOUNTER — Ambulatory Visit (INDEPENDENT_AMBULATORY_CARE_PROVIDER_SITE_OTHER): Payer: PRIVATE HEALTH INSURANCE | Admitting: Internal Medicine

## 2014-11-11 ENCOUNTER — Encounter: Payer: Self-pay | Admitting: Internal Medicine

## 2014-11-11 VITALS — BP 120/70 | HR 94 | Temp 98.0°F | Wt 116.0 lb

## 2014-11-11 DIAGNOSIS — R319 Hematuria, unspecified: Secondary | ICD-10-CM

## 2014-11-11 LAB — POCT URINALYSIS DIPSTICK
BILIRUBIN UA: NEGATIVE
Blood, UA: NEGATIVE
Glucose, UA: NEGATIVE
KETONES UA: NEGATIVE
Leukocytes, UA: NEGATIVE
Nitrite, UA: NEGATIVE
PH UA: 6
Protein, UA: NEGATIVE
Spec Grav, UA: 1.025
Urobilinogen, UA: NEGATIVE

## 2014-11-11 MED ORDER — SULFAMETHOXAZOLE-TRIMETHOPRIM 800-160 MG PO TABS: 1.0000 | ORAL_TABLET | Freq: Two times a day (BID) | ORAL | Status: DC

## 2014-11-11 NOTE — Progress Notes (Signed)
Pre visit review using our clinic review tool, if applicable. No additional management support is needed unless otherwise documented below in the visit note. 

## 2014-11-11 NOTE — Assessment & Plan Note (Signed)
Classic symptoms for infection She is worried about bladder cancer due to her smoking ---but the worry actually made her smoke more Urinalysis is benign now  Will treat for 3 days with septra anyway If she has any recurrence, we will proceed with urology evaluation

## 2014-11-11 NOTE — Patient Instructions (Signed)
Please take the antibiotic twice a day for 3 days. You can save the rest. If the bleeding recurs, I will send you to a urologist.
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
## 2015-01-07 ENCOUNTER — Other Ambulatory Visit: Payer: Self-pay | Admitting: Internal Medicine

## 2015-01-09 ENCOUNTER — Ambulatory Visit (INDEPENDENT_AMBULATORY_CARE_PROVIDER_SITE_OTHER): Payer: PRIVATE HEALTH INSURANCE | Admitting: Internal Medicine

## 2015-01-09 ENCOUNTER — Encounter: Payer: Self-pay | Admitting: Internal Medicine

## 2015-01-09 VITALS — BP 120/78 | HR 87 | Temp 98.4°F | Wt 114.5 lb

## 2015-01-09 DIAGNOSIS — R1084 Generalized abdominal pain: Secondary | ICD-10-CM

## 2015-01-09 DIAGNOSIS — R109 Unspecified abdominal pain: Secondary | ICD-10-CM | POA: Insufficient documentation

## 2015-01-09 NOTE — Telephone Encounter (Signed)
Approved: #60 x 5 

## 2015-01-09 NOTE — Assessment & Plan Note (Signed)
Very vague about the pain---states it is burning but not clearly an acid process Seems to be related to her worsening constipation Does note sensitivity to tomato and spicy---so may have some element of GERD  She was worried about IBD---her pain seems to be related to her bowels not moving. This is unlikely Doubt gallbladder--history not consistent with this No consistent with diverticular disease either She is now more worried about cancer, etc---her worry makes things worse She is now dreading going--and doesn't even know what it will take  Will give her a bowel regimen to clear out and hopefully do better overall I would recommend a colonoscopy--- so will make a referral (but told her I think that cancer is unlikely with her symptoms) If she clears out, and the burning continues, may want to start PPI just in case

## 2015-01-09 NOTE — Patient Instructions (Signed)
Please take miralax, 1 capful with a full glass of water, twice a day. It may take 2-3 days for this to fully take effect. If you have not moved your bowels by Wednesday, please try dulcolax (biscodyl 5mg  orally). I would expect this will make you go within an hour or two--otherwise you may need a couple more days of miralax. Once you have gone, I would recommend the miralax a capful daily to keep your bowels regular. You should try to sit on the commode after breakfast for a few minutes to hopefully get into the habit of going regularly. You will hear in the next couple of days about the referrral to Dr Mechele CollinElliott.
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
## 2015-01-09 NOTE — Progress Notes (Signed)
Pre visit review using our clinic review tool, if applicable. No additional management support is needed unless otherwise documented below in the visit note. 

## 2015-01-09 NOTE — Telephone Encounter (Signed)
Last filled 09/2014 #60 with 3 refills--last OV 11/2014--please advise

## 2015-01-09 NOTE — Telephone Encounter (Signed)
Refill sent to pharmacy electronically. °

## 2015-03-07 ENCOUNTER — Other Ambulatory Visit: Payer: Self-pay | Admitting: Internal Medicine

## 2015-03-08 ENCOUNTER — Other Ambulatory Visit: Payer: Self-pay | Admitting: Internal Medicine

## 2015-03-09 ENCOUNTER — Encounter: Payer: Self-pay | Admitting: Internal Medicine

## 2015-03-09 ENCOUNTER — Ambulatory Visit (INDEPENDENT_AMBULATORY_CARE_PROVIDER_SITE_OTHER): Payer: Managed Care, Other (non HMO) | Admitting: Internal Medicine

## 2015-03-09 VITALS — BP 108/68 | HR 92 | Temp 97.5°F | Ht 65.0 in | Wt 119.0 lb

## 2015-03-09 DIAGNOSIS — G8929 Other chronic pain: Secondary | ICD-10-CM

## 2015-03-09 DIAGNOSIS — Z0000 Encounter for general adult medical examination without abnormal findings: Secondary | ICD-10-CM | POA: Diagnosis not present

## 2015-03-09 DIAGNOSIS — R51 Headache: Secondary | ICD-10-CM

## 2015-03-09 DIAGNOSIS — M797 Fibromyalgia: Secondary | ICD-10-CM

## 2015-03-09 DIAGNOSIS — E039 Hypothyroidism, unspecified: Secondary | ICD-10-CM | POA: Diagnosis not present

## 2015-03-09 NOTE — Assessment & Plan Note (Addendum)
Chronic sleep issues and pain Advised that she must get back to regular exercise Discussed trying to go back up on the amitriptyline to 20mg  at bedtime

## 2015-03-09 NOTE — Assessment & Plan Note (Signed)
Generally healthy but ongoing functional issues with sleep, fibromyalgia, headaches, phobias.... Due for mammogram but she only wants this from Dr Jennette KettleNeal Getting colosure with Dr Mechele CollinElliott

## 2015-03-09 NOTE — Assessment & Plan Note (Signed)
Ongoing issues with this Does use the imitrex Still on topiramate prophylaxis

## 2015-03-09 NOTE — Assessment & Plan Note (Signed)
Seems to be euthyroid Due for labs 
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
## 2015-03-09 NOTE — Progress Notes (Signed)
Pre visit review using our clinic review tool, if applicable. No additional management support is needed unless otherwise documented below in the visit note. 

## 2015-03-10 ENCOUNTER — Encounter: Payer: Self-pay | Admitting: *Deleted

## 2015-03-10 LAB — CBC WITH DIFFERENTIAL/PLATELET
BASOS PCT: 0.5 % (ref 0.0–3.0)
Basophils Absolute: 0 10*3/uL (ref 0.0–0.1)
EOS ABS: 0.2 10*3/uL (ref 0.0–0.7)
Eosinophils Relative: 2.2 % (ref 0.0–5.0)
HCT: 39 % (ref 36.0–46.0)
HEMOGLOBIN: 13.4 g/dL (ref 12.0–15.0)
Lymphocytes Relative: 31.4 % (ref 12.0–46.0)
Lymphs Abs: 2.3 10*3/uL (ref 0.7–4.0)
MCHC: 34.4 g/dL (ref 30.0–36.0)
MCV: 89.1 fl (ref 78.0–100.0)
Monocytes Absolute: 0.5 10*3/uL (ref 0.1–1.0)
Monocytes Relative: 7 % (ref 3.0–12.0)
NEUTROS ABS: 4.3 10*3/uL (ref 1.4–7.7)
Neutrophils Relative %: 58.9 % (ref 43.0–77.0)
Platelets: 196 10*3/uL (ref 150.0–400.0)
RBC: 4.38 Mil/uL (ref 3.87–5.11)
RDW: 12.1 % (ref 11.5–15.5)
WBC: 7.2 10*3/uL (ref 4.0–10.5)

## 2015-03-10 LAB — COMPREHENSIVE METABOLIC PANEL
ALK PHOS: 68 U/L (ref 39–117)
ALT: 8 U/L (ref 0–35)
AST: 16 U/L (ref 0–37)
Albumin: 4.2 g/dL (ref 3.5–5.2)
BILIRUBIN TOTAL: 0.5 mg/dL (ref 0.2–1.2)
BUN: 17 mg/dL (ref 6–23)
CO2: 26 mEq/L (ref 19–32)
Calcium: 9.3 mg/dL (ref 8.4–10.5)
Chloride: 108 mEq/L (ref 96–112)
Creatinine, Ser: 0.82 mg/dL (ref 0.40–1.20)
GFR: 76.78 mL/min (ref >60.00)
Glucose, Bld: 94 mg/dL (ref 70–99)
Potassium: 3.6 mEq/L (ref 3.5–5.1)
SODIUM: 140 meq/L (ref 135–145)
Total Protein: 6.6 g/dL (ref 6.0–8.3)

## 2015-03-10 LAB — TSH: TSH: 0.4 u[IU]/mL (ref 0.35–4.50)

## 2015-03-10 LAB — T4, FREE: FREE T4: 1.04 ng/dL (ref 0.60–1.60)

## 2015-04-24 LAB — COLOGUARD: Cologuard: NEGATIVE

## 2015-05-17 ENCOUNTER — Other Ambulatory Visit: Payer: Self-pay | Admitting: Neurology

## 2015-05-17 ENCOUNTER — Ambulatory Visit
Admission: RE | Admit: 2015-05-17 | Discharge: 2015-05-17 | Disposition: A | Discharge status: Home / Self Care | Payer: 59 | Source: Ambulatory Visit | Attending: Neurology | Admitting: Neurology

## 2015-05-17 DIAGNOSIS — M542 Cervicalgia: Secondary | ICD-10-CM

## 2015-05-22 ENCOUNTER — Telehealth: Payer: Self-pay | Admitting: Internal Medicine

## 2015-05-22 NOTE — Telephone Encounter (Signed)
Pt called. Stated her gynecologist, Dr. Varney Baas took labs last week and found some alarming results with her vitamin D levels and thyroid levels. Dr. Donnetta Hail office was supposed to fax the results last week for your review. Pt called to see if you had received them yet? If not, I have already told the pt to call Dr. Donnetta Hail office again and have them re-fax results just in case.    Dr. Jennette Kettle phone # [phone]

## 2015-05-22 NOTE — Telephone Encounter (Signed)
.  left message to have patient return my call.  

## 2015-05-22 NOTE — Telephone Encounter (Signed)
Spoke with patient and she had them to fax over again, she also has started taking vit D 50,000 once weekly.

## 2015-05-22 NOTE — Telephone Encounter (Signed)
I did not get any results from Dr Jennette Kettle I just checked her thyroid in APril and it was fine

## 2015-05-24 ENCOUNTER — Other Ambulatory Visit: Payer: Self-pay

## 2015-05-24 ENCOUNTER — Emergency Department
Admission: EM | Admit: 2015-05-24 | Discharge: 2015-05-24 | Disposition: A | Discharge status: Home / Self Care | Payer: 59 | Attending: Emergency Medicine | Admitting: Emergency Medicine

## 2015-05-24 DIAGNOSIS — Z79899 Other long term (current) drug therapy: Secondary | ICD-10-CM | POA: Diagnosis not present

## 2015-05-24 DIAGNOSIS — G44209 Tension-type headache, unspecified, not intractable: Secondary | ICD-10-CM | POA: Insufficient documentation

## 2015-05-24 DIAGNOSIS — G8929 Other chronic pain: Secondary | ICD-10-CM | POA: Insufficient documentation

## 2015-05-24 DIAGNOSIS — Z880 Allergy status to penicillin: Secondary | ICD-10-CM | POA: Insufficient documentation

## 2015-05-24 DIAGNOSIS — Z720 Tobacco use: Secondary | ICD-10-CM | POA: Insufficient documentation

## 2015-05-24 DIAGNOSIS — M542 Cervicalgia: Secondary | ICD-10-CM | POA: Diagnosis present
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
## 2015-05-24 NOTE — ED Notes (Signed)
AAOx3.  Skin warm and dry.  Patient discussing current medication list and states that since menopause she has a daily headache and since starting her hormone patch, her headache and neck pain has worsened.  Patient is anxious and tearful when discussing her current health situation.  Emotional support given and well received with moderate effect.

## 2015-05-24 NOTE — Discharge Instructions (Signed)
Musculoskeletal Pain Musculoskeletal pain is muscle and boney aches and pains. These pains can occur in any part of the body. Your caregiver may treat you without knowing the cause of the pain. They may treat you if blood or urine tests, X-rays, and other tests were normal.  CAUSES There is often not a definite cause or reason for these pains. These pains may be caused by a type of germ (virus). The discomfort may also come from overuse. Overuse includes working out too hard when your body is not fit. Boney aches also come from weather changes. Bone is sensitive to atmospheric pressure changes. HOME CARE INSTRUCTIONS   Ask when your test results will be ready. Make sure you get your test results.  Only take over-the-counter or prescription medicines for pain, discomfort, or fever as directed by your caregiver. If you were given medications for your condition, do not drive, operate machinery or power tools, or sign legal documents for 24 hours. Do not drink alcohol. Do not take sleeping pills or other medications that may interfere with treatment.  Continue all activities unless the activities cause more pain. When the pain lessens, slowly resume normal activities. Gradually increase the intensity and duration of the activities or exercise.  During periods of severe pain, bed rest may be helpful. Lay or sit in any position that is comfortable.  Putting ice on the injured area.  Put ice in a bag.  Place a towel between your skin and the bag.  Leave the ice on for 15 to 20 minutes, 3 to 4 times a day.  Follow up with your caregiver for continued problems and no reason can be found for the pain. If the pain becomes worse or does not go away, it may be necessary to repeat tests or do additional testing. Your caregiver may need to look further for a possible cause. SEEK IMMEDIATE MEDICAL CARE IF:  You have pain that is getting worse and is not relieved by medications.  You develop chest pain  that is associated with shortness or breath, sweating, feeling sick to your stomach (nauseous), or throw up (vomit).  Your pain becomes localized to the abdomen.  You develop any new symptoms that seem different or that concern you. MAKE SURE YOU:   Understand these instructions.  Will watch your condition.  Will get help right away if you are not doing well or get worse. Document Released: 11/18/2005 Document Revised: 02/10/2012 Document Reviewed: 07/23/2013 [facility] Patient Information 2015 [place], [place]. This information is not intended to replace advice given to you by your health care provider. Make sure you discuss any questions you have with your health care provider.  Tension Headache A tension headache is a feeling of pain, pressure, or aching often felt over the front and sides of the head. The pain can be dull or can feel tight (constricting). It is the most common type of headache. Tension headaches are not normally associated with nausea or vomiting and do not get worse with physical activity. Tension headaches can last 30 minutes to several days.  CAUSES  The exact cause is not known, but it may be caused by chemicals and hormones in the brain that lead to pain. Tension headaches often begin after stress, anxiety, or depression. Other triggers may include:  Alcohol.  Caffeine (too much or withdrawal).  Respiratory infections (colds, flu, sinus infections).  Dental problems or teeth clenching.  Fatigue.  Holding your head and neck in one position too long while using  a computer. SYMPTOMS   Pressure around the head.   Dull, aching head pain.   Pain felt over the front and sides of the head.   Tenderness in the muscles of the head, neck, and shoulders. DIAGNOSIS  A tension headache is often diagnosed based on:   Symptoms.   Physical examination.   A CT scan or MRI of your head. These tests may be ordered if symptoms are severe or unusual. TREATMENT    Medicines may be given to help relieve symptoms.  HOME CARE INSTRUCTIONS   Only take over-the-counter or prescription medicines for pain or discomfort as directed by your caregiver.   Lie down in a dark, quiet room when you have a headache.   Keep a journal to find out what may be triggering your headaches. For example, write down:  What you eat and drink.  How much sleep you get.  Any change to your diet or medicines.  Try massage or other relaxation techniques.   Ice packs or heat applied to the head and neck can be used. Use these 3 to 4 times per day for 15 to 20 minutes each time, or as needed.   Limit stress.   Sit up straight, and do not tense your muscles.   Quit smoking if you smoke.  Limit alcohol use.  Decrease the amount of caffeine you drink, or stop drinking caffeine.  Eat and exercise regularly.  Get 7 to 9 hours of sleep, or as recommended by your caregiver.  Avoid excessive use of pain medicine as recurrent headaches can occur.  SEEK MEDICAL CARE IF:   You have problems with the medicines you were prescribed.  Your medicines do not work.  You have a change from the usual headache.  You have nausea or vomiting. SEEK IMMEDIATE MEDICAL CARE IF:   Your headache becomes severe.  You have a fever.  You have a stiff neck.  You have loss of vision.  You have muscular weakness or loss of muscle control.  You lose your balance or have trouble walking.  You feel faint or pass out.  You have severe symptoms that are different from your first symptoms. MAKE SURE YOU:   Understand these instructions.  Will watch your condition.  Will get help right away if you are not doing well or get worse. Document Released: 11/18/2005 Document Revised: 02/10/2012 Document Reviewed: 11/08/2011 [facility] Patient Information 2015 [place], [place]. This information is not intended to replace advice given to you by your health care provider. Make sure you  discuss any questions you have with your health care provider.

## 2015-05-24 NOTE — ED Notes (Signed)
AAOx3.  Skin warm and dry.  Ambulates with easy and steady gait.  Posture upright and relaxed.  D/c home.

## 2015-05-24 NOTE — ED Notes (Signed)
C/o neck, upper back, and jaw pain.  Onset of pain Monday.  Also states at times pain shoots down legs.Marland Kitchen Has history of migraines and fibromyalgia.

## 2015-05-24 NOTE — ED Notes (Signed)
Patient arrives to [facility] ED with c/o bilateral chest pain radiating into bilateral neck, moreso on left side since Monday. Patient is a smoker on a hormone patch for menopause. Patient states she can only take flurbiprofen for pain, has a hx of fibromyalgia. Adamantly denies/refuses any and all labwork. Patient states that 'being stuck with a needle is NOT an option'. This RN advised her of the risks of denying labwork in the presence of chest pain. Patient still denies labwork despite education.

## 2015-05-25 NOTE — Telephone Encounter (Signed)
Left VM for patient with results from Dr. Alphonsus Sias that was hand written on the fax (will be scanned in) advised pt to call if any questions.  Handwritten on fax: thyroid borderline, no change needed (since normal here in April) Vit D slightly low, recommend 1000 I.U. Daily can get OTC.

## 2015-06-01 ENCOUNTER — Ambulatory Visit: Payer: 59 | Attending: Neurology | Admitting: Physical Therapy

## 2015-06-01 DIAGNOSIS — M542 Cervicalgia: Secondary | ICD-10-CM | POA: Insufficient documentation

## 2015-06-01 DIAGNOSIS — G8929 Other chronic pain: Secondary | ICD-10-CM

## 2015-06-01 DIAGNOSIS — M6281 Muscle weakness (generalized): Secondary | ICD-10-CM

## 2015-06-01 DIAGNOSIS — R51 Headache: Secondary | ICD-10-CM
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
## 2015-06-14 ENCOUNTER — Ambulatory Visit: Payer: 59 | Attending: Neurology | Admitting: Physical Therapy

## 2015-06-14 DIAGNOSIS — M6281 Muscle weakness (generalized): Secondary | ICD-10-CM | POA: Diagnosis present

## 2015-06-14 DIAGNOSIS — M542 Cervicalgia: Secondary | ICD-10-CM | POA: Diagnosis present
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
## 2015-06-15 ENCOUNTER — Encounter: Payer: 59 | Admitting: Physical Therapy

## 2015-06-15 ENCOUNTER — Ambulatory Visit: Payer: 59 | Admitting: Physical Therapy

## 2015-06-15 DIAGNOSIS — M542 Cervicalgia: Secondary | ICD-10-CM

## 2015-06-15 DIAGNOSIS — M6281 Muscle weakness (generalized): Secondary | ICD-10-CM
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
## 2015-06-19 ENCOUNTER — Ambulatory Visit: Payer: 59 | Admitting: Physical Therapy

## 2015-06-19 DIAGNOSIS — M542 Cervicalgia: Secondary | ICD-10-CM | POA: Diagnosis not present
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
## 2015-06-22 ENCOUNTER — Encounter: Payer: 59 | Admitting: Physical Therapy

## 2015-06-26 ENCOUNTER — Ambulatory Visit: Payer: 59

## 2015-06-26 ENCOUNTER — Encounter: Payer: Self-pay | Admitting: Physical Therapy

## 2015-06-26 DIAGNOSIS — M542 Cervicalgia: Secondary | ICD-10-CM | POA: Diagnosis not present

## 2015-06-26 DIAGNOSIS — M6281 Muscle weakness (generalized): Secondary | ICD-10-CM
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
## 2015-06-29 ENCOUNTER — Ambulatory Visit: Payer: 59

## 2015-06-29 DIAGNOSIS — M6281 Muscle weakness (generalized): Secondary | ICD-10-CM

## 2015-06-29 DIAGNOSIS — M542 Cervicalgia: Secondary | ICD-10-CM | POA: Diagnosis not present
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
## 2015-07-03 ENCOUNTER — Ambulatory Visit: Payer: 59 | Attending: Neurology | Admitting: Physical Therapy

## 2015-07-03 DIAGNOSIS — M542 Cervicalgia: Secondary | ICD-10-CM

## 2015-07-03 LAB — HM PAP SMEAR

## 2015-07-03 LAB — HM MAMMOGRAPHY: HM Mammogram: NORMAL (ref 0–4)
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
## 2015-07-06 ENCOUNTER — Ambulatory Visit: Payer: 59 | Admitting: Physical Therapy

## 2015-07-06 DIAGNOSIS — M542 Cervicalgia: Secondary | ICD-10-CM
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
## 2015-07-17 ENCOUNTER — Ambulatory Visit: Payer: 59 | Admitting: Physical Therapy

## 2015-08-02 ENCOUNTER — Encounter: Payer: 59 | Admitting: Physical Therapy

## 2015-11-29 ENCOUNTER — Telehealth: Payer: Self-pay | Admitting: Internal Medicine

## 2015-11-29 ENCOUNTER — Ambulatory Visit (INDEPENDENT_AMBULATORY_CARE_PROVIDER_SITE_OTHER): Payer: 59 | Admitting: Family Medicine

## 2015-11-29 ENCOUNTER — Encounter: Payer: Self-pay | Admitting: Family Medicine

## 2015-11-29 DIAGNOSIS — R079 Chest pain, unspecified: Secondary | ICD-10-CM | POA: Insufficient documentation

## 2015-11-29 DIAGNOSIS — R0789 Other chest pain: Secondary | ICD-10-CM | POA: Diagnosis not present

## 2015-11-29 MED ORDER — CYCLOBENZAPRINE HCL 10 MG PO TABS: 10.0000 mg | ORAL_TABLET | Freq: Three times a day (TID) | ORAL | Status: DC | PRN

## 2015-11-29 NOTE — Progress Notes (Signed)
Subjective:  Patient ID: Shari Graham, female    DOB: [birth year]  Age: 54 y.o. MRN: [id number]  CC: Chest pain  HPI:  54 year old female with a past medical history of chronic headache and fibromyalgia presents with complaints of chest pain.  Chest pain  Patient reports that she's had intermittent chest pain for the past month.  Pain is located in the left lower rib cage under the left breast.  She reports the pain is intermittent.  Pain is intermittently severe. She reports that 2 days ago she had intense pain. She states that this occurred after pulling a ladder from the attic.  No associated palpitations, increasing shortness of breath, diaphoresis.  Not associated with exertion.  No known relieving factors.  Additionally, she reports that she's been "more emotional" recently. She states that she's also been more tired than usual and continues to complain of insomnia.  Social Hx   Social History   Social History  . Marital Status: Single    Spouse Name: N/A  . Number of Children: 1  . Years of Education: N/A   Occupational History  . Social Worker     [employer]   Social History Main Topics  . Smoking status: Current Every Day Smoker -- 0.75 packs/day for 30 years    Types: Cigarettes  . Smokeless tobacco: Never Used     Comment: Not ready to quit  . Alcohol Use: 0.0 oz/week    0 Standard drinks or equivalent per week     Comment: rarely  . Drug Use: No  . Sexual Activity: Not Asked   Other Topics Concern  . None   Social History Narrative   1 son--Evan   Review of Systems  Constitutional: Negative for diaphoresis.  Respiratory:       No increasing SOB.  Cardiovascular: Positive for chest pain. Negative for palpitations.   Objective:  BP 112/72 mmHg  Pulse 99  Temp(Src) 97.9 F (36.6 C) (Oral)  Ht  (1.651 m)  Wt 111 lb 8 oz (50.576 kg)  BMI 18.55 kg/m2  SpO2 99%  BP/Weight 11/29/2015 05/24/2015 03/09/2015  Systolic BP 112  147 108  Diastolic BP 72 72 68  Wt. (Lbs) 111.5 125 119  BMI 18.55 20.8 19.8   Physical Exam  Constitutional: She appears well-developed. No distress.  Cardiovascular: Normal rate and regular rhythm.   No murmur heard. Pulmonary/Chest: Effort normal and breath sounds normal. No respiratory distress. She has no wheezes. She has no rales.  Musculoskeletal:  Left lower ribs - mildly tender to palpation.  Neurological: She is alert.  Psychiatric:  Flat affect.  Vitals reviewed.  Lab Results  Component Value Date   WBC 7.2 03/09/2015   HGB 13.4 03/09/2015   HCT 39.0 03/09/2015   PLT 196.0 03/09/2015   GLUCOSE 94 03/09/2015   CHOL 179 03/07/2014   TRIG 114.0 03/07/2014   HDL 50.30 03/07/2014   LDLCALC 106* 03/07/2014   ALT 8 03/09/2015   AST 16 03/09/2015   NA 140 03/09/2015   K 3.6 03/09/2015   CL 108 03/09/2015   CREATININE 0.82 03/09/2015   BUN 17 03/09/2015   CO2 26 03/09/2015   TSH 0.40 03/09/2015   EKG - normal sinus rhythm at the rate of 90. Normal intervals. No ST or T-wave changes. No signs of ischemia. Normal EKG.  Assessment & Plan:   Problem List Items Addressed This Visit    Chest pain    New problem. EKG  obtained today. Interpretation: Normal. Based on patient's history and physical exam as well as normal EKG, her chest pain is likely MSK in origin. Advised PRN Flexeril (rx given today). PRN NSAID's.       Relevant Medications   cyclobenzaprine (FLEXERIL) 10 MG tablet   Other Relevant Orders   EKG 12-Lead (Completed)      Meds ordered this encounter  Medications  . cyclobenzaprine (FLEXERIL) 10 MG tablet    Sig: Take 1 tablet (10 mg total) by mouth 3 (three) times daily as needed for muscle spasms.    Dispense:  30 tablet    Refill:  0    Follow-up: PRN  Everlene OtherJayce Takako Minckler DO [facility] [place]

## 2015-11-29 NOTE — Assessment & Plan Note (Signed)
New problem. EKG obtained today. Interpretation: Normal. Based on patient's history and physical exam as well as normal EKG, her chest pain is likely MSK in origin. Advised PRN Flexeril (rx given today). PRN NSAID's.

## 2015-11-29 NOTE — Telephone Encounter (Signed)
[facility] - Client TELEPHONE ADVICE RECORD   [facility]     Patient Name: Shari Graham Initial Comment Caller states she does not want to go to the ER. She is having chest pain below her left breast; making her panic and be nervous. would rather have an EKG in the office.   DOB: 01/11/1961      Nurse Assessment  Nurse: Tera Materowan, RN, Elnita Maxwellheryl Date/Time (Eastern Time): 11/29/2015 11:23:14 AM  Confirm and document reason for call. If symptomatic, describe symptoms. ---Caller states that she has been having intermittent pain under her left breast for the last month and that it has gotten much worse over the last 2 days. Pt is very anxious and states that talking about it is making it worse.  Has the patient traveled out of the country within the last 30 days? ---Not Applicable  Does the patient have any new or worsening symptoms? ---Yes  Will a triage be completed? ---Yes  Related visit to physician within the last 2 weeks? ---No  Does the PT have any chronic conditions? (i.e. diabetes, asthma, etc.) ---Yes  List chronic conditions. ---thyroid dx, smoker  Is this a behavioral health or substance abuse call? ---No    Guidelines     Guideline Title Affirmed Question Affirmed Notes   Chest Pain [1] Chest pain lasts > 5 minutes AND [2] age > 2450    Final Disposition User   Call EMS 911 Now Tera Materowan, RN, Elnita Maxwellheryl       Disagree/Comply: Disagree   Disagree

## 2015-11-29 NOTE — Patient Instructions (Signed)
Your pain is musculoskeletal in nature.  Use the flexeril as needed.  You can also use over the counter nsaids.  Follow up closely with Dr. Alphonsus SiasLetvak.  Take care  Dr. Adriana Simasook

## 2015-11-29 NOTE — Telephone Encounter (Signed)
okay

## 2015-11-29 NOTE — Progress Notes (Signed)
Pre visit review using our clinic review tool, if applicable. No additional management support is needed unless otherwise documented below in the visit note. 

## 2015-11-29 NOTE — Telephone Encounter (Signed)
Pt [facility] 11/29/15 at 1:15 pm.

## 2016-03-04 ENCOUNTER — Other Ambulatory Visit: Payer: Self-pay

## 2016-03-04 MED ORDER — LEVOTHYROXINE SODIUM 100 MCG PO TABS: ORAL_TABLET | ORAL | Status: DC

## 2016-03-04 MED ORDER — MONTELUKAST SODIUM 10 MG PO TABS: 10.0000 mg | ORAL_TABLET | Freq: Every day | ORAL | Status: DC

## 2016-03-04 NOTE — Telephone Encounter (Signed)
Rxs sent electronically.  

## 2016-03-11 ENCOUNTER — Ambulatory Visit (INDEPENDENT_AMBULATORY_CARE_PROVIDER_SITE_OTHER): Payer: 59 | Admitting: Internal Medicine

## 2016-03-11 ENCOUNTER — Encounter: Payer: Self-pay | Admitting: Internal Medicine

## 2016-03-11 VITALS — BP 118/88 | HR 79 | Temp 98.2°F | Ht 65.0 in | Wt 106.0 lb

## 2016-03-11 DIAGNOSIS — Z0000 Encounter for general adult medical examination without abnormal findings: Secondary | ICD-10-CM

## 2016-03-11 DIAGNOSIS — E039 Hypothyroidism, unspecified: Secondary | ICD-10-CM

## 2016-03-11 DIAGNOSIS — G8929 Other chronic pain: Secondary | ICD-10-CM

## 2016-03-11 DIAGNOSIS — E538 Deficiency of other specified B group vitamins: Secondary | ICD-10-CM | POA: Insufficient documentation

## 2016-03-11 DIAGNOSIS — R51 Headache: Secondary | ICD-10-CM | POA: Diagnosis not present

## 2016-03-11 DIAGNOSIS — M797 Fibromyalgia: Secondary | ICD-10-CM

## 2016-03-11 NOTE — Progress Notes (Signed)
Pre visit review using our clinic review tool, if applicable. No additional management support is needed unless otherwise documented below in the visit note. 

## 2016-03-11 NOTE — Assessment & Plan Note (Signed)
Controlled on current regimen--despite stress

## 2016-03-11 NOTE — Assessment & Plan Note (Signed)
Healthy but lots of stress with mom's care and upheaval of her entire life UTD on cancer screening

## 2016-03-11 NOTE — Addendum Note (Signed)
Addended by: Baldomero LamyHAVERS, NATASHA C on: 03/11/2016 04:17 PM   Modules accepted: Kipp BroodSmartSet

## 2016-03-11 NOTE — Assessment & Plan Note (Signed)
Will recheck levels

## 2016-03-11 NOTE — Assessment & Plan Note (Signed)
Will recheck labs 

## 2016-03-11 NOTE — Progress Notes (Signed)
   Subjective:    Patient ID: Shari Graham, female    DOB: 30-Nov-1961, 55 y.o.   MRN: [id number]  HPI Here for physical  Lots of stress --mom came to live with her after stroke Takes a little time off--but working fairly normal work schedule Caregivers only there when she is at [employer]---she is doing the rest Does have some night help and occasionally on Saturday Has lost a lot of weight---but got some back (feels she is at her normal now) Had been smoking less--now back to higher amount  Ongoing fibromyalgia Feels this is relatively controlled Hasn't been able to exercise--due to caregiving  Did see Dr Jennette KettleNeal last summer Pap and mammogram fine Started estrogen patch---- instantly helped her headaches  Review of Systems  Constitutional: Negative for fatigue.       Wears wear seat  HENT: Negative for dental problem, hearing loss and tinnitus.        Keeps up with dentist  Eyes: Negative for visual disturbance.       No diplopia or unilateral vision loss  Respiratory: Negative for cough, chest tightness and shortness of breath.   Cardiovascular: Negative for chest pain and leg swelling.  Gastrointestinal: Negative for nausea, vomiting, abdominal pain, constipation and blood in stool.  Endocrine: Negative for polydipsia and polyuria.  Genitourinary: Negative for dysuria, hematuria and dyspareunia.       Mild stress incontinence Reviewed safe sex--none now  Musculoskeletal: Negative for back pain, joint swelling and arthralgias.  Skin: Negative for rash.       Few non painful varicosities No suspicious lesions  Allergic/Immunologic: Positive for environmental allergies. Negative for immunocompromised state.       Singulair controlling  Neurological: Positive for headaches. Negative for dizziness, syncope, weakness and light-headedness.  Hematological: Negative for adenopathy. Does not bruise/bleed easily.  Psychiatric/Behavioral: Positive for sleep disturbance. Negative for  dysphoric mood. The patient is nervous/anxious.        Sleep tough with mom there, etc       Objective:   Physical Exam  Constitutional: She is oriented to person, place, and time. She appears well-developed and well-nourished. No distress.  HENT:  Head: Normocephalic and atraumatic.  Right Ear: External ear normal.  Left Ear: External ear normal.  Mouth/Throat: Oropharynx is clear and moist. No oropharyngeal exudate.  Eyes: Conjunctivae are normal. Pupils are equal, round, and reactive to light.  Neck: Normal range of motion. Neck supple. No thyromegaly present.  Cardiovascular: Normal rate, regular rhythm, normal heart sounds and intact distal pulses.  Exam reveals no gallop.   No murmur heard. Pulmonary/Chest: Effort normal and breath sounds normal. No respiratory distress. She has no wheezes. She has no rales.  Abdominal: Soft. There is no tenderness.  Musculoskeletal: She exhibits no edema or tenderness.  Lymphadenopathy:    She has no cervical adenopathy.  Neurological: She is alert and oriented to person, place, and time.  Skin: No rash noted. No erythema.  Psychiatric: She has a normal mood and affect. Her behavior is normal.          Assessment & Plan:

## 2016-03-11 NOTE — Assessment & Plan Note (Signed)
Better with the estrogen

## 2016-03-12 LAB — COMPREHENSIVE METABOLIC PANEL
ALK PHOS: 59 U/L (ref 39–117)
ALT: 10 U/L (ref 0–35)
AST: 20 U/L (ref 0–37)
Albumin: 4.4 g/dL (ref 3.5–5.2)
BILIRUBIN TOTAL: 0.5 mg/dL (ref 0.2–1.2)
BUN: 18 mg/dL (ref 6–23)
CO2: 24 meq/L (ref 19–32)
Calcium: 9.3 mg/dL (ref 8.4–10.5)
Chloride: 111 mEq/L (ref 96–112)
Creatinine, Ser: 0.8 mg/dL (ref 0.40–1.20)
GFR: 78.71 mL/min (ref >60.00)
GLUCOSE: 83 mg/dL (ref 70–99)
Potassium: 3.9 mEq/L (ref 3.5–5.1)
SODIUM: 143 meq/L (ref 135–145)
Total Protein: 6.7 g/dL (ref 6.0–8.3)

## 2016-03-12 LAB — CBC WITH DIFFERENTIAL/PLATELET
Basophils Absolute: 0.1 10*3/uL (ref 0.0–0.1)
Basophils Relative: 0.7 % (ref 0.0–3.0)
Eosinophils Absolute: 0.1 10*3/uL (ref 0.0–0.7)
Eosinophils Relative: 1.5 % (ref 0.0–5.0)
HEMATOCRIT: 40.7 % (ref 36.0–46.0)
Hemoglobin: 14 g/dL (ref 12.0–15.0)
LYMPHS ABS: 2.3 10*3/uL (ref 0.7–4.0)
Lymphocytes Relative: 27 % (ref 12.0–46.0)
MCHC: 34.5 g/dL (ref 30.0–36.0)
MCV: 89.5 fl (ref 78.0–100.0)
MONOS PCT: 6.3 % (ref 3.0–12.0)
Monocytes Absolute: 0.5 10*3/uL (ref 0.1–1.0)
NEUTROS ABS: 5.4 10*3/uL (ref 1.4–7.7)
Neutrophils Relative %: 64.5 % (ref 43.0–77.0)
PLATELETS: 195 10*3/uL (ref 150.0–400.0)
RBC: 4.55 Mil/uL (ref 3.87–5.11)
RDW: 12.1 % (ref 11.5–15.5)
WBC: 8.4 10*3/uL (ref 4.0–10.5)

## 2016-03-12 LAB — TSH: TSH: 0.62 u[IU]/mL (ref 0.35–4.50)

## 2016-03-12 LAB — T4, FREE: Free T4: 1.01 ng/dL (ref 0.60–1.60)

## 2016-03-12 LAB — VITAMIN B12: VITAMIN B 12: 791 pg/mL (ref 211–911)

## 2016-04-08 ENCOUNTER — Other Ambulatory Visit: Payer: Self-pay

## 2016-04-08 MED ORDER — LEVOTHYROXINE SODIUM 100 MCG PO TABS: ORAL_TABLET | ORAL | Status: DC

## 2016-04-08 NOTE — Telephone Encounter (Signed)
Rx sent electronically.  

## 2016-09-04 ENCOUNTER — Ambulatory Visit: Payer: 59 | Admitting: Family Medicine

## 2016-09-04 ENCOUNTER — Ambulatory Visit (INDEPENDENT_AMBULATORY_CARE_PROVIDER_SITE_OTHER): Payer: 59 | Admitting: Family Medicine

## 2016-09-04 ENCOUNTER — Encounter: Payer: Self-pay | Admitting: Family Medicine

## 2016-09-04 VITALS — BP 110/80 | HR 85 | Temp 98.2°F | Wt 110.1 lb

## 2016-09-04 DIAGNOSIS — J0100 Acute maxillary sinusitis, unspecified: Secondary | ICD-10-CM | POA: Diagnosis not present

## 2016-09-04 MED ORDER — AZITHROMYCIN 250 MG PO TABS: ORAL_TABLET | ORAL | 0 refills | Status: DC
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
## 2016-09-04 NOTE — Patient Instructions (Signed)
For nasal congestion you can use Afrin nasal spray for 3 days max, Sudafed, saline nasal spray (generic is fine for all). For cough you can try Delsym. Drink enough fluids to make your urine light yellow. For fever/chill/muscle aches you can take over the counter acetaminophen or ibuprofen.  Please come back in if you are not better in 5-7 days or if you develop wheezing, shortness of breath or persistent vomiting.   

## 2017-02-18 ENCOUNTER — Other Ambulatory Visit: Payer: Self-pay | Admitting: Internal Medicine

## 2017-03-24 ENCOUNTER — Other Ambulatory Visit: Payer: Self-pay | Admitting: Internal Medicine

## 2017-03-24 ENCOUNTER — Encounter: Payer: Self-pay | Admitting: Internal Medicine

## 2017-03-24 ENCOUNTER — Ambulatory Visit (INDEPENDENT_AMBULATORY_CARE_PROVIDER_SITE_OTHER): Payer: 59 | Admitting: Internal Medicine

## 2017-03-24 VITALS — BP 112/70 | HR 74 | Temp 98.4°F | Ht 64.5 in | Wt 109.2 lb

## 2017-03-24 DIAGNOSIS — R51 Headache: Secondary | ICD-10-CM | POA: Diagnosis not present

## 2017-03-24 DIAGNOSIS — E039 Hypothyroidism, unspecified: Secondary | ICD-10-CM | POA: Diagnosis not present

## 2017-03-24 DIAGNOSIS — Z0000 Encounter for general adult medical examination without abnormal findings: Secondary | ICD-10-CM | POA: Diagnosis not present

## 2017-03-24 DIAGNOSIS — R519 Headache, unspecified: Secondary | ICD-10-CM
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
## 2017-03-24 NOTE — Assessment & Plan Note (Signed)
Doing fairly well on current prophylactic regimen

## 2017-03-24 NOTE — Assessment & Plan Note (Signed)
Healthy Plans to get back to fitness Discussed cigarette cessation mammo yearly at gyn Pap not due Colon screening due next year

## 2017-03-24 NOTE — Progress Notes (Signed)
Pre visit review using our clinic review tool, if applicable. No additional management support is needed unless otherwise documented below in the visit note. 

## 2017-03-24 NOTE — Assessment & Plan Note (Signed)
Seems euthyroid ?Will check labs ?

## 2017-03-25 ENCOUNTER — Encounter: Payer: Self-pay | Admitting: *Deleted

## 2017-03-25 LAB — CBC WITH DIFFERENTIAL/PLATELET
BASOS PCT: 1 % (ref 0.0–3.0)
Basophils Absolute: 0.1 10*3/uL (ref 0.0–0.1)
EOS PCT: 1.2 % (ref 0.0–5.0)
Eosinophils Absolute: 0.1 10*3/uL (ref 0.0–0.7)
HCT: 40.6 % (ref 36.0–46.0)
Hemoglobin: 13.8 g/dL (ref 12.0–15.0)
LYMPHS ABS: 2.4 10*3/uL (ref 0.7–4.0)
Lymphocytes Relative: 25.4 % (ref 12.0–46.0)
MCHC: 34 g/dL (ref 30.0–36.0)
MCV: 90.7 fl (ref 78.0–100.0)
MONO ABS: 0.7 10*3/uL (ref 0.1–1.0)
MONOS PCT: 7.6 % (ref 3.0–12.0)
NEUTROS PCT: 64.8 % (ref 43.0–77.0)
Neutro Abs: 6.2 10*3/uL (ref 1.4–7.7)
Platelets: 215 10*3/uL (ref 150.0–400.0)
RBC: 4.48 Mil/uL (ref 3.87–5.11)
RDW: 12.1 % (ref 11.5–15.5)
WBC: 9.5 10*3/uL (ref 4.0–10.5)

## 2017-03-25 LAB — COMPREHENSIVE METABOLIC PANEL
ALK PHOS: 44 U/L (ref 39–117)
ALT: 5 U/L (ref 0–35)
AST: 12 U/L (ref 0–37)
Albumin: 4.1 g/dL (ref 3.5–5.2)
BUN: 15 mg/dL (ref 6–23)
CHLORIDE: 111 meq/L (ref 96–112)
CO2: 25 mEq/L (ref 19–32)
Calcium: 9.1 mg/dL (ref 8.4–10.5)
Creatinine, Ser: 0.77 mg/dL (ref 0.40–1.20)
GFR: 81.96 mL/min (ref >60.00)
Glucose, Bld: 87 mg/dL (ref 70–99)
POTASSIUM: 3.9 meq/L (ref 3.5–5.1)
Sodium: 141 mEq/L (ref 135–145)
Total Bilirubin: 0.4 mg/dL (ref 0.2–1.2)
Total Protein: 6.1 g/dL (ref 6.0–8.3)

## 2017-03-25 LAB — T4, FREE: FREE T4: 1.12 ng/dL (ref 0.60–1.60)

## 2017-03-25 LAB — TSH: TSH: 0.21 u[IU]/mL — ABNORMAL LOW (ref 0.35–4.50)

## 2017-05-01 ENCOUNTER — Other Ambulatory Visit: Payer: Self-pay | Admitting: Internal Medicine

## 2017-05-09 ENCOUNTER — Encounter: Payer: Self-pay | Admitting: Family

## 2017-05-09 ENCOUNTER — Ambulatory Visit (INDEPENDENT_AMBULATORY_CARE_PROVIDER_SITE_OTHER): Payer: Managed Care, Other (non HMO) | Admitting: Family

## 2017-05-09 VITALS — BP 116/80 | HR 87 | Temp 98.3°F | Ht 64.5 in | Wt 113.2 lb

## 2017-05-09 DIAGNOSIS — J320 Chronic maxillary sinusitis: Secondary | ICD-10-CM

## 2017-05-09 MED ORDER — DOXYCYCLINE HYCLATE 100 MG PO TABS: 100.0000 mg | ORAL_TABLET | Freq: Two times a day (BID) | ORAL | 0 refills | Status: DC

## 2017-05-09 NOTE — Patient Instructions (Signed)
Let's start treatment for sinusitis with doxycycline.  Please take this medication with food. Please also  Avoid  being out in the sun for long periods of time as this medication can make skin  more sensitive to sunburn.   Ensure to take probiotics while on antibiotics and also for 2 weeks after completion. It is important to re-colonize the gut with good bacteria and also to prevent any diarrheal infections associated with antibiotic use.   Referral to ENT due to chronicity of sinus infections as well as worsening headache.   Let me know if symptoms do not improve.
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
## 2017-05-09 NOTE — Progress Notes (Signed)
Pre visit review using our clinic review tool, if applicable. No additional management support is needed unless otherwise documented below in the visit note. 

## 2017-12-16 ENCOUNTER — Ambulatory Visit: Payer: Managed Care, Other (non HMO) | Admitting: Internal Medicine

## 2017-12-16 ENCOUNTER — Ambulatory Visit (INDEPENDENT_AMBULATORY_CARE_PROVIDER_SITE_OTHER)
Admission: RE | Admit: 2017-12-16 | Discharge: 2017-12-16 | Disposition: A | Discharge status: Home / Self Care | Payer: Managed Care, Other (non HMO) | Source: Ambulatory Visit | Attending: Internal Medicine | Admitting: Internal Medicine

## 2017-12-16 ENCOUNTER — Encounter: Payer: Self-pay | Admitting: Internal Medicine

## 2017-12-16 VITALS — BP 132/76 | HR 95 | Temp 97.6°F | Wt 110.5 lb

## 2017-12-16 DIAGNOSIS — R079 Chest pain, unspecified: Secondary | ICD-10-CM | POA: Insufficient documentation

## 2017-12-16 DIAGNOSIS — F411 Generalized anxiety disorder: Secondary | ICD-10-CM | POA: Insufficient documentation

## 2017-12-16 DIAGNOSIS — F419 Anxiety disorder, unspecified: Secondary | ICD-10-CM

## 2017-12-16 LAB — COMPREHENSIVE METABOLIC PANEL
ALT: 6 U/L (ref 0–35)
AST: 13 U/L (ref 0–37)
Albumin: 4.3 g/dL (ref 3.5–5.2)
Alkaline Phosphatase: 46 U/L (ref 39–117)
BUN: 15 mg/dL (ref 6–23)
CALCIUM: 8.7 mg/dL (ref 8.4–10.5)
CO2: 25 mEq/L (ref 19–32)
Chloride: 108 mEq/L (ref 96–112)
Creatinine, Ser: 0.77 mg/dL (ref 0.40–1.20)
GFR: 81.75 mL/min (ref >60.00)
Glucose, Bld: 100 mg/dL — ABNORMAL HIGH (ref 70–99)
Potassium: 3.3 mEq/L — ABNORMAL LOW (ref 3.5–5.1)
Sodium: 140 mEq/L (ref 135–145)
TOTAL PROTEIN: 6.6 g/dL (ref 6.0–8.3)
Total Bilirubin: 0.4 mg/dL (ref 0.2–1.2)

## 2017-12-16 LAB — CBC
HCT: 42.5 % (ref 36.0–46.0)
HEMOGLOBIN: 14.2 g/dL (ref 12.0–15.0)
MCHC: 33.5 g/dL (ref 30.0–36.0)
MCV: 92.1 fl (ref 78.0–100.0)
Platelets: 217 10*3/uL (ref 150.0–400.0)
RBC: 4.61 Mil/uL (ref 3.87–5.11)
RDW: 11.9 % (ref 11.5–15.5)
WBC: 7.7 10*3/uL (ref 4.0–10.5)

## 2017-12-16 LAB — LIPID PANEL
CHOLESTEROL: 155 mg/dL (ref 0–200)
HDL: 59.6 mg/dL (ref >39.00)
LDL CALC: 82 mg/dL (ref 0–99)
NonHDL: 95.4
TRIGLYCERIDES: 69 mg/dL (ref 0.0–149.0)
Total CHOL/HDL Ratio: 3
VLDL: 13.8 mg/dL (ref 0.0–40.0)

## 2017-12-16 LAB — T4, FREE: Free T4: 1.13 ng/dL (ref 0.60–1.60)

## 2017-12-16 MED ORDER — ALPRAZOLAM 0.25 MG PO TABS: 0.2500 mg | ORAL_TABLET | Freq: Two times a day (BID) | ORAL | 0 refills | Status: DC | PRN

## 2017-12-16 NOTE — Assessment & Plan Note (Signed)
Chronic but now in panic over this Will give Rx for alprazolam

## 2017-12-16 NOTE — Assessment & Plan Note (Addendum)
Severe with DOE as well She is convinced that this is cardiac--and it may well be in this smoker Could be pulmonary also Chronic so don't suspect PE  Will check CXR and EKG CXR shows hyperinflation but no other abnormalities EKG is sinus at 81. Normal axis and intervals. No ischemic changes. No change from past EKG Discussed options--I recommend treadmill stress test. If normal, will refer to pulmonary She needs to stop smoking altogether
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
## 2017-12-19 ENCOUNTER — Telehealth: Payer: Self-pay | Admitting: *Deleted

## 2017-12-19 NOTE — Telephone Encounter (Signed)
The patient is scheduled for GXT in our office on Monday 12/22/17 at 2:00 pm. I left a message on her identified voice mail: - light breakfast/ light lunch - athletic attire w/ tennis or non-slip shoes - she may take her medications - no smoking/ caffeine for 24 hours prior to the test  I asked that she call back with any questions. Address given for our practice with a description of the location.

## 2017-12-22 ENCOUNTER — Telehealth: Payer: Self-pay

## 2017-12-22 NOTE — Telephone Encounter (Signed)
Patient declined morning appt

## 2017-12-22 NOTE — Telephone Encounter (Signed)
Lmov for patient. Change in schedule, can not do treadmill exam in office today Will await call back to reschedule this.

## 2017-12-22 NOTE — Telephone Encounter (Signed)
Lmov for patient Has treadmill this afternoon but will need to see if she can come this morning.

## 2017-12-24 ENCOUNTER — Telehealth: Payer: Self-pay | Admitting: *Deleted

## 2017-12-24 NOTE — Telephone Encounter (Signed)
No answer. Called and left message reminding patient about treadmill stress test tomorrow. Left detailed message to not have caffeine or smoke 24 hours before and to wear sneakers or walking shoes and to call back if any questions or the need to reschedule.

## 2017-12-25 ENCOUNTER — Ambulatory Visit (INDEPENDENT_AMBULATORY_CARE_PROVIDER_SITE_OTHER): Payer: Managed Care, Other (non HMO)

## 2017-12-25 DIAGNOSIS — R079 Chest pain, unspecified: Secondary | ICD-10-CM

## 2017-12-25 LAB — EXERCISE TOLERANCE TEST
CSEPEW: 10.1 METS
Exercise duration (min): 8 min
Exercise duration (sec): 50 s
MPHR: 162 {beats}/min
Peak HR: 150 {beats}/min
Percent HR: 92 %
Rest HR: 83 {beats}/min

## 2017-12-26 ENCOUNTER — Other Ambulatory Visit: Payer: Self-pay | Admitting: Internal Medicine

## 2017-12-26 DIAGNOSIS — R0609 Other forms of dyspnea: Principal | ICD-10-CM

## 2018-01-05 ENCOUNTER — Encounter: Payer: Self-pay | Admitting: Internal Medicine

## 2018-01-05 ENCOUNTER — Ambulatory Visit: Payer: Managed Care, Other (non HMO) | Admitting: Internal Medicine

## 2018-01-05 VITALS — BP 138/78 | HR 92 | Ht 64.5 in | Wt 111.0 lb

## 2018-01-05 DIAGNOSIS — R079 Chest pain, unspecified: Secondary | ICD-10-CM

## 2018-01-05 DIAGNOSIS — J449 Chronic obstructive pulmonary disease, unspecified: Secondary | ICD-10-CM | POA: Diagnosis not present

## 2018-01-05 DIAGNOSIS — R9389 Abnormal findings on diagnostic imaging of other specified body structures: Secondary | ICD-10-CM | POA: Diagnosis not present

## 2018-01-05 DIAGNOSIS — R0602 Shortness of breath: Secondary | ICD-10-CM | POA: Diagnosis not present

## 2018-01-05 MED ORDER — TIOTROPIUM BROMIDE MONOHYDRATE 1.25 MCG/ACT IN AERS: 2.0000 | INHALATION_SPRAY | Freq: Two times a day (BID) | RESPIRATORY_TRACT | 5 refills | Status: DC

## 2018-01-05 NOTE — Patient Instructions (Addendum)
Obtain PFT's Obtain CT chest to assess for nodules Check Check ONO Start Spiriva Respimat 1.25
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
## 2018-01-12 ENCOUNTER — Other Ambulatory Visit: Payer: Self-pay | Admitting: Internal Medicine

## 2018-01-13 ENCOUNTER — Ambulatory Visit: Payer: Managed Care, Other (non HMO)

## 2018-01-16 ENCOUNTER — Ambulatory Visit (INDEPENDENT_AMBULATORY_CARE_PROVIDER_SITE_OTHER): Payer: Managed Care, Other (non HMO) | Admitting: *Deleted

## 2018-01-16 DIAGNOSIS — R079 Chest pain, unspecified: Secondary | ICD-10-CM

## 2018-01-16 NOTE — Progress Notes (Signed)
SMW performed today.  SIX MIN WALK 01/16/2018  Medications Estradiol, Levothyroxine, Singulair, Topamax  Supplimental Oxygen during Test? (L/min) No  Laps 7  Partial Lap (in Meters) 36  Baseline BP (sitting) 130/90  Baseline Heartrate 98  Baseline Dyspnea (Borg Scale) 0  Baseline Fatigue (Borg Scale) 2  Baseline SPO2 100  BP (sitting) 132/100  Heartrate 102  Dyspnea (Borg Scale) 0  Fatigue (Borg Scale) 1  SPO2 100  BP (sitting) 126/90  Heartrate 88  SPO2 100  Stopped or Paused before Six Minutes No  Distance Completed 372

## 2018-01-19 ENCOUNTER — Encounter: Payer: Self-pay | Admitting: Internal Medicine

## 2018-01-19 ENCOUNTER — Ambulatory Visit
Admission: RE | Admit: 2018-01-19 | Discharge: 2018-01-19 | Disposition: A | Discharge status: Home / Self Care | Payer: Managed Care, Other (non HMO) | Source: Ambulatory Visit | Attending: Internal Medicine | Admitting: Internal Medicine

## 2018-01-19 DIAGNOSIS — R918 Other nonspecific abnormal finding of lung field: Secondary | ICD-10-CM | POA: Diagnosis not present

## 2018-01-19 DIAGNOSIS — J439 Emphysema, unspecified: Secondary | ICD-10-CM | POA: Insufficient documentation

## 2018-01-19 DIAGNOSIS — R0602 Shortness of breath: Secondary | ICD-10-CM

## 2018-01-19 DIAGNOSIS — J449 Chronic obstructive pulmonary disease, unspecified: Secondary | ICD-10-CM

## 2018-01-19 DIAGNOSIS — I700 Atherosclerosis of aorta: Secondary | ICD-10-CM | POA: Insufficient documentation

## 2018-01-19 DIAGNOSIS — R9389 Abnormal findings on diagnostic imaging of other specified body structures: Secondary | ICD-10-CM

## 2018-01-19 DIAGNOSIS — R079 Chest pain, unspecified: Secondary | ICD-10-CM

## 2018-01-22 ENCOUNTER — Telehealth: Payer: Self-pay | Admitting: *Deleted

## 2018-01-22 NOTE — Telephone Encounter (Signed)
Results of ONO normal. No 02 needed at this time.

## 2018-01-22 NOTE — Telephone Encounter (Signed)
Pt made aware she does not need 02 with sleep per ONO. Pt would like results of CT scan.

## 2018-01-23 ENCOUNTER — Ambulatory Visit: Payer: Managed Care, Other (non HMO) | Admitting: Family Medicine

## 2018-01-23 ENCOUNTER — Encounter: Payer: Self-pay | Admitting: Internal Medicine

## 2018-01-23 ENCOUNTER — Ambulatory Visit: Payer: Managed Care, Other (non HMO) | Admitting: Internal Medicine

## 2018-01-23 VITALS — BP 130/86 | HR 96 | Temp 97.9°F | Wt 111.0 lb

## 2018-01-23 DIAGNOSIS — J439 Emphysema, unspecified: Secondary | ICD-10-CM | POA: Diagnosis not present

## 2018-01-23 DIAGNOSIS — F419 Anxiety disorder, unspecified: Secondary | ICD-10-CM | POA: Diagnosis not present

## 2018-01-23 DIAGNOSIS — R030 Elevated blood-pressure reading, without diagnosis of hypertension: Secondary | ICD-10-CM | POA: Diagnosis not present

## 2018-01-23 DIAGNOSIS — J449 Chronic obstructive pulmonary disease, unspecified: Secondary | ICD-10-CM | POA: Insufficient documentation

## 2018-01-23 DIAGNOSIS — I700 Atherosclerosis of aorta: Secondary | ICD-10-CM | POA: Insufficient documentation

## 2018-01-23 MED ORDER — MIRTAZAPINE 15 MG PO TABS: 7.5000 mg | ORAL_TABLET | Freq: Every day | ORAL | 3 refills | Status: DC

## 2018-01-23 MED ORDER — METOPROLOL SUCCINATE ER 25 MG PO TB24: 25.0000 mg | ORAL_TABLET | Freq: Every day | ORAL | 3 refills | Status: DC

## 2018-01-23 NOTE — Patient Instructions (Signed)
Start the 2 medications, 1/2 of the mirtazapine at bedtime and try the 1/2 of the metoprolol daily in the morning. If no side effects, increase the metoprolol to a full pill. The mirtazapine can be increased if it is not helping your sleep.

## 2018-01-23 NOTE — Assessment & Plan Note (Signed)
Chronic but now much worse Can't sleep Did poorly with a different anxiety med---but doesn't remember what one Will try mirtazapine to help her sleep (though not depressed really)

## 2018-01-23 NOTE — Assessment & Plan Note (Signed)
Hard to tell if this is from the anxiety--or caused by it She is close to manic with sleep deprivation, etc Can feel the blood pressure Will try beta blocker--may help with migraines also

## 2018-01-23 NOTE — Telephone Encounter (Signed)
PFT already ordered and [facility].

## 2018-01-23 NOTE — Telephone Encounter (Signed)
Please obtain PFT as well and will call patient when all tests completed
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
## 2018-01-23 NOTE — Assessment & Plan Note (Signed)
Awaiting PFTs Discussed cigarette cessation--but she has to calm down first

## 2018-02-26 ENCOUNTER — Ambulatory Visit: Payer: Managed Care, Other (non HMO) | Admitting: Internal Medicine

## 2018-02-26 ENCOUNTER — Encounter: Payer: Self-pay | Admitting: Internal Medicine

## 2018-02-26 VITALS — BP 120/84 | HR 84 | Temp 98.0°F | Ht 65.0 in | Wt 112.0 lb

## 2018-02-26 DIAGNOSIS — R030 Elevated blood-pressure reading, without diagnosis of hypertension: Secondary | ICD-10-CM

## 2018-02-26 DIAGNOSIS — M797 Fibromyalgia: Secondary | ICD-10-CM

## 2018-02-26 MED ORDER — CLONAZEPAM 0.5 MG PO TABS: 0.5000 mg | ORAL_TABLET | Freq: Every day | ORAL | 0 refills | Status: DC
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
## 2018-02-26 NOTE — Assessment & Plan Note (Signed)
BP Readings from Last 3 Encounters:  02/26/18 120/84  01/23/18 130/86  01/05/18 138/78   Better at home now on the metoprolol Seems to have helped her headache (maybe migraines also) Will continue the 1/2 tab daily

## 2018-02-26 NOTE — Assessment & Plan Note (Signed)
Ongoing problems and worse with allergies and not sleeping Very anxious still Will try clonazepam at night

## 2018-03-17 ENCOUNTER — Other Ambulatory Visit: Payer: Self-pay | Admitting: Internal Medicine

## 2018-03-26 ENCOUNTER — Encounter: Payer: 59 | Admitting: Internal Medicine

## 2018-04-06 ENCOUNTER — Other Ambulatory Visit: Payer: Self-pay | Admitting: Internal Medicine

## 2018-04-07 NOTE — Telephone Encounter (Signed)
Last filled 02-26-18 #60 Last OV 02-26-18 Next OV 06-18-18  Forwarding to Dr Ermalene Searing in Dr Karle Starch absence

## 2018-04-13 ENCOUNTER — Other Ambulatory Visit: Payer: Self-pay | Admitting: Internal Medicine

## 2018-04-16 ENCOUNTER — Encounter: Payer: Self-pay | Admitting: Internal Medicine

## 2018-04-16 ENCOUNTER — Ambulatory Visit: Payer: Managed Care, Other (non HMO) | Admitting: Internal Medicine

## 2018-04-16 VITALS — BP 106/68 | HR 82 | Resp 16 | Ht 65.0 in | Wt 107.0 lb

## 2018-04-16 DIAGNOSIS — F17210 Nicotine dependence, cigarettes, uncomplicated: Secondary | ICD-10-CM | POA: Diagnosis not present

## 2018-04-16 DIAGNOSIS — J479 Bronchiectasis, uncomplicated: Secondary | ICD-10-CM

## 2018-04-16 DIAGNOSIS — Z716 Tobacco abuse counseling: Secondary | ICD-10-CM
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
## 2018-04-16 NOTE — Patient Instructions (Signed)
STOP SMOKING!!!!!! 

## 2018-06-18 ENCOUNTER — Encounter: Payer: Managed Care, Other (non HMO) | Admitting: Internal Medicine

## 2018-06-18 DIAGNOSIS — Z0289 Encounter for other administrative examinations: Secondary | ICD-10-CM

## 2018-07-12 ENCOUNTER — Other Ambulatory Visit: Payer: Self-pay | Admitting: Family Medicine

## 2018-07-13 NOTE — Telephone Encounter (Signed)
Last filled 04-07-18 #60 Last OV 02-26-18 No Future OV

## 2018-10-13 ENCOUNTER — Other Ambulatory Visit: Payer: Self-pay | Admitting: Internal Medicine

## 2018-10-13 NOTE — Telephone Encounter (Signed)
Last filled 07-13-18 #60 Last OV 02-26-18 Next OV 10-26-18

## 2018-10-26 ENCOUNTER — Encounter

## 2018-10-26 ENCOUNTER — Encounter: Payer: Managed Care, Other (non HMO) | Admitting: Internal Medicine

## 2018-11-12 ENCOUNTER — Other Ambulatory Visit: Payer: Self-pay

## 2018-11-12 MED ORDER — LEVOTHYROXINE SODIUM 100 MCG PO TABS: ORAL_TABLET | ORAL | 3 refills | Status: DC

## 2019-01-26 ENCOUNTER — Ambulatory Visit (INDEPENDENT_AMBULATORY_CARE_PROVIDER_SITE_OTHER): Payer: Managed Care, Other (non HMO) | Admitting: Internal Medicine

## 2019-01-26 ENCOUNTER — Encounter: Payer: Self-pay | Admitting: Internal Medicine

## 2019-01-26 VITALS — BP 124/82 | HR 76 | Temp 97.9°F | Wt 106.0 lb

## 2019-01-26 DIAGNOSIS — J329 Chronic sinusitis, unspecified: Secondary | ICD-10-CM | POA: Diagnosis not present

## 2019-01-26 DIAGNOSIS — B9789 Other viral agents as the cause of diseases classified elsewhere: Secondary | ICD-10-CM
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
## 2019-01-26 NOTE — Patient Instructions (Signed)

## 2019-02-13 ENCOUNTER — Other Ambulatory Visit: Payer: Self-pay | Admitting: Internal Medicine

## 2019-02-15 NOTE — Telephone Encounter (Signed)
Last filled 10-13-18 #60 Last OV 02-26-18 Next OV 02-23-19 [facility]

## 2019-02-19 ENCOUNTER — Telehealth: Payer: Self-pay | Admitting: Internal Medicine

## 2019-02-19 NOTE — Telephone Encounter (Signed)
I spoke to patient and let her know her cpx appt was being cancelled and I'll call her back to r/s appt.

## 2019-02-23 ENCOUNTER — Encounter: Payer: Managed Care, Other (non HMO) | Admitting: Internal Medicine

## 2019-03-15 ENCOUNTER — Other Ambulatory Visit: Payer: Self-pay | Admitting: Internal Medicine

## 2019-04-16 ENCOUNTER — Other Ambulatory Visit: Payer: Self-pay | Admitting: Internal Medicine

## 2019-05-17 ENCOUNTER — Other Ambulatory Visit: Payer: Self-pay | Admitting: Internal Medicine

## 2019-06-14 ENCOUNTER — Other Ambulatory Visit: Payer: Self-pay | Admitting: Internal Medicine

## 2019-07-17 ENCOUNTER — Other Ambulatory Visit: Payer: Self-pay | Admitting: Internal Medicine

## 2019-07-19 DIAGNOSIS — R69 Illness, unspecified: Secondary | ICD-10-CM | POA: Diagnosis not present

## 2019-07-19 DIAGNOSIS — G43019 Migraine without aura, intractable, without status migrainosus: Secondary | ICD-10-CM | POA: Diagnosis not present

## 2019-07-19 DIAGNOSIS — M5481 Occipital neuralgia: Secondary | ICD-10-CM | POA: Diagnosis not present

## 2019-08-15 ENCOUNTER — Other Ambulatory Visit: Payer: Self-pay | Admitting: Internal Medicine

## 2019-08-21 ENCOUNTER — Telehealth: Payer: Self-pay | Admitting: Internal Medicine

## 2019-08-23 NOTE — Telephone Encounter (Signed)
Patient scheduled cpx on 08/26/19.

## 2019-08-23 NOTE — Telephone Encounter (Signed)
I refilled her metoprolol. She needs to reschedule her physical that was cancelled due to Covid. At your earliest convenience, can you help her get scheduled? Thanks

## 2019-08-26 ENCOUNTER — Encounter: Payer: Self-pay | Admitting: Internal Medicine

## 2019-08-26 ENCOUNTER — Ambulatory Visit (INDEPENDENT_AMBULATORY_CARE_PROVIDER_SITE_OTHER): Payer: 59 | Admitting: Internal Medicine

## 2019-08-26 ENCOUNTER — Other Ambulatory Visit: Payer: Self-pay

## 2019-08-26 VITALS — BP 118/70 | HR 88 | Temp 97.7°F | Ht 64.5 in | Wt 115.8 lb

## 2019-08-26 DIAGNOSIS — Z0000 Encounter for general adult medical examination without abnormal findings: Secondary | ICD-10-CM | POA: Diagnosis not present

## 2019-08-26 DIAGNOSIS — E039 Hypothyroidism, unspecified: Secondary | ICD-10-CM

## 2019-08-26 DIAGNOSIS — R51 Headache: Secondary | ICD-10-CM

## 2019-08-26 DIAGNOSIS — Z23 Encounter for immunization: Secondary | ICD-10-CM | POA: Diagnosis not present

## 2019-08-26 DIAGNOSIS — Z1211 Encounter for screening for malignant neoplasm of colon: Secondary | ICD-10-CM | POA: Diagnosis not present

## 2019-08-26 DIAGNOSIS — R519 Headache, unspecified: Secondary | ICD-10-CM

## 2019-08-26 DIAGNOSIS — J449 Chronic obstructive pulmonary disease, unspecified: Secondary | ICD-10-CM
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
## 2019-08-26 NOTE — Addendum Note (Signed)
Addended by: Tammi Sou on: 08/26/2019 04:00 PM   Modules accepted: Orders

## 2019-08-26 NOTE — Assessment & Plan Note (Signed)
Starting new preventative med

## 2019-08-26 NOTE — Assessment & Plan Note (Signed)
Due for blood work

## 2019-08-26 NOTE — Assessment & Plan Note (Signed)
Healthy Discussed increasing exercise Hasn't been able to stop smoking Td today Flu vaccine FIT Gets pap and mammograms with Dr Nori Riis

## 2019-08-26 NOTE — Assessment & Plan Note (Signed)
No Rx Discussed cutting back more on the cigarettes

## 2019-08-27 LAB — COMPREHENSIVE METABOLIC PANEL
ALT: 5 U/L (ref 0–35)
AST: 14 U/L (ref 0–37)
Albumin: 4.3 g/dL (ref 3.5–5.2)
Alkaline Phosphatase: 65 U/L (ref 39–117)
BUN: 13 mg/dL (ref 6–23)
CO2: 25 mEq/L (ref 19–32)
Calcium: 8.9 mg/dL (ref 8.4–10.5)
Chloride: 107 mEq/L (ref 96–112)
Creatinine, Ser: 0.87 mg/dL (ref 0.40–1.20)
GFR: 66.42 mL/min (ref >60.00)
Glucose, Bld: 89 mg/dL (ref 70–99)
Potassium: 3.8 mEq/L (ref 3.5–5.1)
Sodium: 139 mEq/L (ref 135–145)
Total Bilirubin: 0.4 mg/dL (ref 0.2–1.2)
Total Protein: 6.9 g/dL (ref 6.0–8.3)

## 2019-08-27 LAB — CBC
HCT: 41.4 % (ref 36.0–46.0)
Hemoglobin: 14.1 g/dL (ref 12.0–15.0)
MCHC: 34 g/dL (ref 30.0–36.0)
MCV: 89.2 fl (ref 78.0–100.0)
Platelets: 234 10*3/uL (ref 150.0–400.0)
RBC: 4.64 Mil/uL (ref 3.87–5.11)
RDW: 12.3 % (ref 11.5–15.5)
WBC: 7.5 10*3/uL (ref 4.0–10.5)

## 2019-08-27 LAB — T4, FREE: Free T4: 1 ng/dL (ref 0.60–1.60)

## 2019-08-27 LAB — TSH: TSH: 6.69 u[IU]/mL — ABNORMAL HIGH (ref 0.35–4.50)

## 2019-09-04 DIAGNOSIS — Z03818 Encounter for observation for suspected exposure to other biological agents ruled out: Secondary | ICD-10-CM | POA: Diagnosis not present

## 2019-09-16 ENCOUNTER — Other Ambulatory Visit: Payer: Self-pay | Admitting: Internal Medicine

## 2019-10-14 ENCOUNTER — Other Ambulatory Visit: Payer: Self-pay

## 2019-10-14 DIAGNOSIS — Z20822 Contact with and (suspected) exposure to covid-19: Secondary | ICD-10-CM

## 2019-10-16 LAB — NOVEL CORONAVIRUS, NAA: SARS-CoV-2, NAA: NOT DETECTED

## 2019-10-19 ENCOUNTER — Other Ambulatory Visit: Payer: Self-pay | Admitting: Internal Medicine

## 2020-01-19 DIAGNOSIS — R69 Illness, unspecified: Secondary | ICD-10-CM | POA: Diagnosis not present

## 2020-01-19 DIAGNOSIS — G43019 Migraine without aura, intractable, without status migrainosus: Secondary | ICD-10-CM | POA: Diagnosis not present

## 2020-01-19 DIAGNOSIS — M5481 Occipital neuralgia: Secondary | ICD-10-CM | POA: Diagnosis not present

## 2020-01-24 ENCOUNTER — Other Ambulatory Visit: Payer: Self-pay

## 2020-01-24 MED ORDER — METOPROLOL SUCCINATE ER 25 MG PO TB24: ORAL_TABLET | ORAL | 3 refills | Status: DC

## 2020-01-24 NOTE — Telephone Encounter (Signed)
Rx sent electronically.  

## 2020-04-28 DIAGNOSIS — R69 Illness, unspecified: Secondary | ICD-10-CM | POA: Diagnosis not present

## 2020-04-28 DIAGNOSIS — G43019 Migraine without aura, intractable, without status migrainosus: Secondary | ICD-10-CM | POA: Diagnosis not present

## 2020-04-28 DIAGNOSIS — M5481 Occipital neuralgia: Secondary | ICD-10-CM | POA: Diagnosis not present

## 2020-05-29 DIAGNOSIS — Z1231 Encounter for screening mammogram for malignant neoplasm of breast: Secondary | ICD-10-CM | POA: Diagnosis not present

## 2020-05-29 DIAGNOSIS — Z681 Body mass index (BMI) 19 or less, adult: Secondary | ICD-10-CM | POA: Diagnosis not present

## 2020-05-29 DIAGNOSIS — Z01419 Encounter for gynecological examination (general) (routine) without abnormal findings: Secondary | ICD-10-CM | POA: Diagnosis not present

## 2020-08-21 ENCOUNTER — Other Ambulatory Visit: Payer: Self-pay | Admitting: Internal Medicine

## 2020-08-21 DIAGNOSIS — Z1211 Encounter for screening for malignant neoplasm of colon: Secondary | ICD-10-CM

## 2020-08-28 ENCOUNTER — Ambulatory Visit: Payer: 59 | Admitting: Internal Medicine

## 2020-08-28 ENCOUNTER — Encounter: Payer: Self-pay | Admitting: Internal Medicine

## 2020-08-28 ENCOUNTER — Other Ambulatory Visit: Payer: Self-pay

## 2020-08-28 VITALS — BP 112/82 | HR 48 | Temp 97.9°F | Ht 64.25 in | Wt 105.0 lb

## 2020-08-28 DIAGNOSIS — Z0000 Encounter for general adult medical examination without abnormal findings: Secondary | ICD-10-CM

## 2020-08-28 DIAGNOSIS — R079 Chest pain, unspecified: Secondary | ICD-10-CM | POA: Diagnosis not present

## 2020-08-28 DIAGNOSIS — E039 Hypothyroidism, unspecified: Secondary | ICD-10-CM | POA: Diagnosis not present

## 2020-08-28 DIAGNOSIS — R519 Headache, unspecified: Secondary | ICD-10-CM

## 2020-08-28 DIAGNOSIS — Z1211 Encounter for screening for malignant neoplasm of colon: Secondary | ICD-10-CM | POA: Diagnosis not present

## 2020-08-28 DIAGNOSIS — I700 Atherosclerosis of aorta: Secondary | ICD-10-CM

## 2020-08-28 DIAGNOSIS — Z23 Encounter for immunization: Secondary | ICD-10-CM

## 2020-08-28 DIAGNOSIS — G8929 Other chronic pain: Secondary | ICD-10-CM | POA: Diagnosis not present

## 2020-08-28 DIAGNOSIS — J449 Chronic obstructive pulmonary disease, unspecified: Secondary | ICD-10-CM
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
## 2020-08-28 NOTE — Addendum Note (Signed)
Addended by: Eual Fines on: 08/28/2020 05:15 PM   Modules accepted: Orders

## 2020-08-28 NOTE — Assessment & Plan Note (Signed)
Discussed taking statin at least once a week She is unclear

## 2020-08-28 NOTE — Assessment & Plan Note (Signed)
Symptoms don't warrant Rx Doesn't think she can stop smoking

## 2020-08-28 NOTE — Assessment & Plan Note (Signed)
Urged her to do the FIT this year Gets mammogram via gyn Dr Neal---and pap smears Not exercising ---"no time"---discussed Flu vaccine Will get COVID booster Not excited about shingrix

## 2020-08-28 NOTE — Assessment & Plan Note (Signed)
On beta blocker, nortriptyline and topiramate for prevention Also on hormonal therapy---estrogen/progesterone

## 2020-08-28 NOTE — Assessment & Plan Note (Signed)
Will check labs

## 2020-08-28 NOTE — Assessment & Plan Note (Signed)
Vague and seems to be more due to anxiety Has risks for CAD Will set up with cardiology

## 2020-08-29 LAB — CBC
HCT: 38.7 % (ref 36.0–46.0)
Hemoglobin: 13.1 g/dL (ref 12.0–15.0)
MCHC: 33.8 g/dL (ref 30.0–36.0)
MCV: 89.2 fl (ref 78.0–100.0)
Platelets: 167 10*3/uL (ref 150.0–400.0)
RBC: 4.34 Mil/uL (ref 3.87–5.11)
RDW: 12.5 % (ref 11.5–15.5)
WBC: 6.9 10*3/uL (ref 4.0–10.5)

## 2020-08-29 LAB — COMPREHENSIVE METABOLIC PANEL
ALT: 6 U/L (ref 0–35)
AST: 12 U/L (ref 0–37)
Albumin: 4.2 g/dL (ref 3.5–5.2)
Alkaline Phosphatase: 41 U/L (ref 39–117)
BUN: 21 mg/dL (ref 6–23)
CO2: 25 mEq/L (ref 19–32)
Calcium: 9 mg/dL (ref 8.4–10.5)
Chloride: 109 mEq/L (ref 96–112)
Creatinine, Ser: 0.8 mg/dL (ref 0.40–1.20)
GFR: 72.92 mL/min (ref >60.00)
Glucose, Bld: 93 mg/dL (ref 70–99)
Potassium: 3.9 mEq/L (ref 3.5–5.1)
Sodium: 141 mEq/L (ref 135–145)
Total Bilirubin: 0.5 mg/dL (ref 0.2–1.2)
Total Protein: 6.3 g/dL (ref 6.0–8.3)

## 2020-08-29 LAB — LIPID PANEL
Cholesterol: 153 mg/dL (ref 0–200)
HDL: 47 mg/dL (ref >39.00)
LDL Cholesterol: 90 mg/dL (ref 0–99)
NonHDL: 105.98
Total CHOL/HDL Ratio: 3
Triglycerides: 80 mg/dL (ref 0.0–149.0)
VLDL: 16 mg/dL (ref 0.0–40.0)

## 2020-08-29 LAB — TSH: TSH: 0.31 u[IU]/mL — ABNORMAL LOW (ref 0.35–4.50)

## 2020-08-29 LAB — T4, FREE: Free T4: 1.1 ng/dL (ref 0.60–1.60)

## 2020-09-08 ENCOUNTER — Encounter: Payer: Self-pay | Admitting: General Practice

## 2020-09-18 ENCOUNTER — Other Ambulatory Visit: Payer: Self-pay | Admitting: Internal Medicine

## 2020-10-18 ENCOUNTER — Other Ambulatory Visit: Payer: Self-pay | Admitting: Internal Medicine

## 2021-02-12 ENCOUNTER — Other Ambulatory Visit: Payer: Self-pay | Admitting: Internal Medicine

## 2021-02-27 DIAGNOSIS — G43019 Migraine without aura, intractable, without status migrainosus: Secondary | ICD-10-CM | POA: Diagnosis not present

## 2021-09-03 ENCOUNTER — Other Ambulatory Visit: Payer: Self-pay

## 2021-09-03 ENCOUNTER — Encounter: Payer: Self-pay | Admitting: Internal Medicine

## 2021-09-03 ENCOUNTER — Ambulatory Visit (INDEPENDENT_AMBULATORY_CARE_PROVIDER_SITE_OTHER): Payer: Managed Care, Other (non HMO) | Admitting: Internal Medicine

## 2021-09-03 VITALS — BP 110/70 | HR 71 | Temp 98.1°F | Ht 64.0 in | Wt 103.6 lb

## 2021-09-03 DIAGNOSIS — Z1211 Encounter for screening for malignant neoplasm of colon: Secondary | ICD-10-CM

## 2021-09-03 DIAGNOSIS — J449 Chronic obstructive pulmonary disease, unspecified: Secondary | ICD-10-CM

## 2021-09-03 DIAGNOSIS — Z23 Encounter for immunization: Secondary | ICD-10-CM | POA: Diagnosis not present

## 2021-09-03 DIAGNOSIS — E039 Hypothyroidism, unspecified: Secondary | ICD-10-CM

## 2021-09-03 DIAGNOSIS — Z0000 Encounter for general adult medical examination without abnormal findings: Secondary | ICD-10-CM

## 2021-09-03 DIAGNOSIS — I700 Atherosclerosis of aorta: Secondary | ICD-10-CM | POA: Diagnosis not present

## 2021-09-03 DIAGNOSIS — M797 Fibromyalgia: Secondary | ICD-10-CM

## 2021-09-03 DIAGNOSIS — F411 Generalized anxiety disorder: Secondary | ICD-10-CM

## 2021-09-03 MED ORDER — DULOXETINE HCL 20 MG PO CPEP: 20.0000 mg | ORAL_CAPSULE | Freq: Every day | ORAL | 3 refills | Status: DC

## 2021-09-03 NOTE — Assessment & Plan Note (Signed)
Much worse Is ready to try something Will go ahead with low dose duloxetine

## 2021-09-03 NOTE — Assessment & Plan Note (Signed)
Chronic pain---worse now with anxiety and being out of her home Will try low dose duloxetine--for anxiety also

## 2021-09-03 NOTE — Assessment & Plan Note (Signed)
Seems euthryoid Will check labs 

## 2021-09-03 NOTE — Assessment & Plan Note (Addendum)
On imaging Not excited about statin given her fibromyalgia (and cholesterol is low)

## 2021-09-03 NOTE — Assessment & Plan Note (Signed)
Mild symptoms No Rx Discussed smoking cessation
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
## 2021-09-03 NOTE — Assessment & Plan Note (Addendum)
Discussed the cigarettes as usual--but she is not ready Will try FIT again--urged her to send it in Flu vaccine today Bivalent COVID vaccine soon Gets mammogram/pap by gyn

## 2021-09-04 LAB — COMPREHENSIVE METABOLIC PANEL
ALT: 5 U/L (ref 0–35)
AST: 14 U/L (ref 0–37)
Albumin: 4.4 g/dL (ref 3.5–5.2)
Alkaline Phosphatase: 44 U/L (ref 39–117)
BUN: 17 mg/dL (ref 6–23)
CO2: 25 mEq/L (ref 19–32)
Calcium: 8.8 mg/dL (ref 8.4–10.5)
Chloride: 108 mEq/L (ref 96–112)
Creatinine, Ser: 0.93 mg/dL (ref 0.40–1.20)
GFR: 66.1 mL/min (ref >60.00)
Glucose, Bld: 84 mg/dL (ref 70–99)
Potassium: 3.9 mEq/L (ref 3.5–5.1)
Sodium: 140 mEq/L (ref 135–145)
Total Bilirubin: 0.4 mg/dL (ref 0.2–1.2)
Total Protein: 6.6 g/dL (ref 6.0–8.3)

## 2021-09-04 LAB — CBC
HCT: 41.6 % (ref 36.0–46.0)
Hemoglobin: 14.2 g/dL (ref 12.0–15.0)
MCHC: 34.1 g/dL (ref 30.0–36.0)
MCV: 90.5 fl (ref 78.0–100.0)
Platelets: 193 10*3/uL (ref 150.0–400.0)
RBC: 4.6 Mil/uL (ref 3.87–5.11)
RDW: 12.3 % (ref 11.5–15.5)
WBC: 7.9 10*3/uL (ref 4.0–10.5)

## 2021-09-04 LAB — LIPID PANEL
Cholesterol: 176 mg/dL (ref 0–200)
HDL: 60.5 mg/dL (ref >39.00)
LDL Cholesterol: 103 mg/dL — ABNORMAL HIGH (ref 0–99)
NonHDL: 115.04
Total CHOL/HDL Ratio: 3
Triglycerides: 62 mg/dL (ref 0.0–149.0)
VLDL: 12.4 mg/dL (ref 0.0–40.0)

## 2021-09-04 LAB — T4, FREE: Free T4: 0.88 ng/dL (ref 0.60–1.60)

## 2021-09-04 LAB — TSH: TSH: 5.94 u[IU]/mL — ABNORMAL HIGH (ref 0.35–5.50)

## 2021-09-10 ENCOUNTER — Other Ambulatory Visit: Payer: Self-pay | Admitting: Internal Medicine

## 2021-10-10 ENCOUNTER — Other Ambulatory Visit: Payer: Self-pay | Admitting: Internal Medicine

## 2021-11-21 ENCOUNTER — Other Ambulatory Visit: Payer: Self-pay | Admitting: Obstetrics & Gynecology

## 2021-11-21 DIAGNOSIS — N6320 Unspecified lump in the left breast, unspecified quadrant: Secondary | ICD-10-CM

## 2021-12-15 ENCOUNTER — Other Ambulatory Visit: Payer: Self-pay | Admitting: Internal Medicine

## 2021-12-20 ENCOUNTER — Ambulatory Visit
Admission: RE | Admit: 2021-12-20 | Discharge: 2021-12-20 | Disposition: A | Discharge status: Home / Self Care | Payer: Managed Care, Other (non HMO) | Source: Ambulatory Visit | Attending: Obstetrics & Gynecology | Admitting: Obstetrics & Gynecology

## 2021-12-20 DIAGNOSIS — N6320 Unspecified lump in the left breast, unspecified quadrant: Secondary | ICD-10-CM

## 2022-01-04 ENCOUNTER — Other Ambulatory Visit: Payer: Managed Care, Other (non HMO)

## 2022-02-24 ENCOUNTER — Other Ambulatory Visit: Payer: Self-pay | Admitting: Internal Medicine

## 2022-07-04 ENCOUNTER — Other Ambulatory Visit: Payer: Self-pay | Admitting: Internal Medicine

## 2022-07-23 DIAGNOSIS — M797 Fibromyalgia: Secondary | ICD-10-CM | POA: Diagnosis not present

## 2022-07-23 DIAGNOSIS — M25511 Pain in right shoulder: Secondary | ICD-10-CM | POA: Diagnosis not present

## 2022-07-23 DIAGNOSIS — M542 Cervicalgia: Secondary | ICD-10-CM | POA: Diagnosis not present

## 2022-07-23 DIAGNOSIS — G43019 Migraine without aura, intractable, without status migrainosus: Secondary | ICD-10-CM | POA: Diagnosis not present

## 2022-08-01 DIAGNOSIS — M7501 Adhesive capsulitis of right shoulder: Secondary | ICD-10-CM | POA: Diagnosis not present

## 2022-08-01 DIAGNOSIS — M25511 Pain in right shoulder: Secondary | ICD-10-CM | POA: Diagnosis not present

## 2022-08-12 ENCOUNTER — Ambulatory Visit: Payer: BC Managed Care – PPO | Admitting: Internal Medicine

## 2022-08-12 ENCOUNTER — Encounter: Payer: Self-pay | Admitting: Internal Medicine

## 2022-08-12 ENCOUNTER — Ambulatory Visit (INDEPENDENT_AMBULATORY_CARE_PROVIDER_SITE_OTHER)
Admission: RE | Admit: 2022-08-12 | Discharge: 2022-08-12 | Disposition: A | Discharge status: Home / Self Care | Payer: BC Managed Care – PPO | Source: Ambulatory Visit | Attending: Internal Medicine | Admitting: Internal Medicine

## 2022-08-12 VITALS — BP 110/70 | HR 72 | Temp 97.8°F | Ht 64.0 in | Wt 99.0 lb

## 2022-08-12 DIAGNOSIS — R634 Abnormal weight loss: Secondary | ICD-10-CM | POA: Diagnosis not present

## 2022-08-12 DIAGNOSIS — R61 Generalized hyperhidrosis: Secondary | ICD-10-CM | POA: Diagnosis not present

## 2022-08-12 DIAGNOSIS — J449 Chronic obstructive pulmonary disease, unspecified: Secondary | ICD-10-CM

## 2022-08-12 DIAGNOSIS — R5383 Other fatigue: Secondary | ICD-10-CM | POA: Insufficient documentation

## 2022-08-12 NOTE — Assessment & Plan Note (Addendum)
Highly unusual for her--who is always cold Still on her estrogen--so shouldn't be hormonal No fevers or signs of infection--though has lost more weight Will check labs and CXR  Could consider trying venlafaxine or gabapentin if no findings) (though she doesn't want gabapentin since son had trouble with it)  CXR shows hyperinflation but no other lesions
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
## 2022-08-12 NOTE — Assessment & Plan Note (Signed)
Very little air movement Doesn't have wheezing or SOB --so I doubt this could be the cause of the sweats Could be contributing to the weight loss though

## 2022-08-12 NOTE — Assessment & Plan Note (Signed)
Part of her chronic fibromyalgia---but not clear if there is something additional Will check blood work

## 2022-08-13 ENCOUNTER — Telehealth: Payer: Self-pay | Admitting: Internal Medicine

## 2022-08-13 LAB — SEDIMENTATION RATE: Sed Rate: 3 mm/hr (ref 0–30)

## 2022-08-13 LAB — COMPREHENSIVE METABOLIC PANEL
ALT: 6 U/L (ref 0–35)
AST: 15 U/L (ref 0–37)
Albumin: 4.4 g/dL (ref 3.5–5.2)
Alkaline Phosphatase: 44 U/L (ref 39–117)
BUN: 18 mg/dL (ref 6–23)
CO2: 20 mEq/L (ref 19–32)
Calcium: 9.2 mg/dL (ref 8.4–10.5)
Chloride: 109 mEq/L (ref 96–112)
Creatinine, Ser: 0.84 mg/dL (ref 0.40–1.20)
GFR: 74.2 mL/min (ref >60.00)
Glucose, Bld: 89 mg/dL (ref 70–99)
Potassium: 3.8 mEq/L (ref 3.5–5.1)
Sodium: 139 mEq/L (ref 135–145)
Total Bilirubin: 0.4 mg/dL (ref 0.2–1.2)
Total Protein: 6.8 g/dL (ref 6.0–8.3)

## 2022-08-13 LAB — CBC
HCT: 42 % (ref 36.0–46.0)
Hemoglobin: 14.3 g/dL (ref 12.0–15.0)
MCHC: 33.9 g/dL (ref 30.0–36.0)
MCV: 90.9 fl (ref 78.0–100.0)
Platelets: 240 10*3/uL (ref 150.0–400.0)
RBC: 4.63 Mil/uL (ref 3.87–5.11)
RDW: 13 % (ref 11.5–15.5)
WBC: 10.3 10*3/uL (ref 4.0–10.5)

## 2022-08-13 LAB — TSH: TSH: 0.81 u[IU]/mL (ref 0.35–5.50)

## 2022-08-13 LAB — T4, FREE: Free T4: 0.97 ng/dL (ref 0.60–1.60)

## 2022-08-13 NOTE — Telephone Encounter (Signed)
Patient called in regarding her results. I did relay the message from Dr. Alphonsus Sias, but she more questions about everything. She can be reached at [phone]. Thank you!

## 2022-08-14 NOTE — Telephone Encounter (Signed)
Called and spoke t pt,. She had not seen her labs on MyChart. I gave her the results and reset her password so she could get on her MyChart. She is wondering what the next step would be in trying to figure out what is going on. I advised her that if she continues to have issues to call and see Dr Alphonsus Sias, again, to decide the next steps.

## 2022-08-22 DIAGNOSIS — M25511 Pain in right shoulder: Secondary | ICD-10-CM | POA: Diagnosis not present

## 2022-08-27 DIAGNOSIS — M25511 Pain in right shoulder: Secondary | ICD-10-CM | POA: Diagnosis not present

## 2022-08-29 DIAGNOSIS — M797 Fibromyalgia: Secondary | ICD-10-CM | POA: Diagnosis not present

## 2022-08-29 DIAGNOSIS — M542 Cervicalgia: Secondary | ICD-10-CM | POA: Diagnosis not present

## 2022-08-29 DIAGNOSIS — G43019 Migraine without aura, intractable, without status migrainosus: Secondary | ICD-10-CM | POA: Diagnosis not present

## 2022-08-29 DIAGNOSIS — M7501 Adhesive capsulitis of right shoulder: Secondary | ICD-10-CM | POA: Diagnosis not present

## 2022-08-30 ENCOUNTER — Other Ambulatory Visit: Payer: Self-pay | Admitting: Neurology

## 2022-08-30 DIAGNOSIS — M25511 Pain in right shoulder: Secondary | ICD-10-CM

## 2022-09-02 ENCOUNTER — Other Ambulatory Visit: Payer: Self-pay | Admitting: Internal Medicine

## 2022-09-03 DIAGNOSIS — M25511 Pain in right shoulder: Secondary | ICD-10-CM | POA: Diagnosis not present

## 2022-09-19 DIAGNOSIS — M25511 Pain in right shoulder: Secondary | ICD-10-CM | POA: Diagnosis not present

## 2022-09-20 DIAGNOSIS — M7501 Adhesive capsulitis of right shoulder: Secondary | ICD-10-CM | POA: Diagnosis not present

## 2022-09-24 ENCOUNTER — Other Ambulatory Visit: Payer: Self-pay | Admitting: Internal Medicine

## 2022-10-01 DIAGNOSIS — M25511 Pain in right shoulder: Secondary | ICD-10-CM | POA: Diagnosis not present

## 2022-12-05 DIAGNOSIS — Z681 Body mass index (BMI) 19 or less, adult: Secondary | ICD-10-CM | POA: Diagnosis not present

## 2022-12-05 DIAGNOSIS — Z1231 Encounter for screening mammogram for malignant neoplasm of breast: Secondary | ICD-10-CM | POA: Diagnosis not present

## 2022-12-05 DIAGNOSIS — Z01419 Encounter for gynecological examination (general) (routine) without abnormal findings: Secondary | ICD-10-CM | POA: Diagnosis not present

## 2022-12-09 ENCOUNTER — Other Ambulatory Visit: Payer: Self-pay | Admitting: Obstetrics and Gynecology

## 2022-12-09 DIAGNOSIS — N63 Unspecified lump in unspecified breast: Secondary | ICD-10-CM

## 2022-12-27 ENCOUNTER — Other Ambulatory Visit: Payer: Self-pay | Admitting: Obstetrics and Gynecology

## 2022-12-27 DIAGNOSIS — N6320 Unspecified lump in the left breast, unspecified quadrant: Secondary | ICD-10-CM

## 2023-01-21 ENCOUNTER — Other Ambulatory Visit: Payer: Self-pay | Admitting: Obstetrics and Gynecology

## 2023-01-21 DIAGNOSIS — N6320 Unspecified lump in the left breast, unspecified quadrant: Secondary | ICD-10-CM

## 2023-02-11 ENCOUNTER — Ambulatory Visit
Admission: RE | Admit: 2023-02-11 | Discharge: 2023-02-11 | Disposition: A | Discharge status: Home / Self Care | Payer: BC Managed Care – PPO | Source: Ambulatory Visit | Attending: Obstetrics and Gynecology | Admitting: Obstetrics and Gynecology

## 2023-02-11 DIAGNOSIS — R922 Inconclusive mammogram: Secondary | ICD-10-CM | POA: Diagnosis not present

## 2023-02-11 DIAGNOSIS — N6320 Unspecified lump in the left breast, unspecified quadrant: Secondary | ICD-10-CM | POA: Diagnosis not present

## 2023-02-11 DIAGNOSIS — N6002 Solitary cyst of left breast: Secondary | ICD-10-CM | POA: Diagnosis not present

## 2023-02-16 ENCOUNTER — Other Ambulatory Visit: Payer: Self-pay

## 2023-02-16 ENCOUNTER — Emergency Department
Admission: EM | Admit: 2023-02-16 | Discharge: 2023-02-16 | Disposition: A | Discharge status: Home / Self Care | Payer: BC Managed Care – PPO | Attending: Emergency Medicine | Admitting: Emergency Medicine

## 2023-02-16 DIAGNOSIS — S61452A Open bite of left hand, initial encounter: Secondary | ICD-10-CM | POA: Diagnosis not present

## 2023-02-16 DIAGNOSIS — W5501XA Bitten by cat, initial encounter: Secondary | ICD-10-CM | POA: Diagnosis not present

## 2023-02-16 MED ORDER — METRONIDAZOLE 500 MG PO TABS: 500.0000 mg | ORAL_TABLET | Freq: Three times a day (TID) | ORAL | 0 refills | Status: DC

## 2023-02-16 MED ORDER — DOXYCYCLINE HYCLATE 100 MG PO TABS: 100.0000 mg | ORAL_TABLET | Freq: Two times a day (BID) | ORAL | 0 refills | Status: DC

## 2023-02-16 NOTE — ED Provider Notes (Signed)
[facility] Provider Note    Event Date/Time   First MD Initiated Contact with Patient 02/16/23 1334     (approximate)   History   Animal Bite   HPI  GENEL Shari Graham is a 62 y.o. female with history of fibromyalgia, COPD, hypothyroidism and osteopenia presents emergency department after a cat bite yesterday.  Patient states the cat has been around her home for the last 2 weeks.  She was rubbing it and finding it but he Meowing so she knew he was hungry.  However she put him down because she did not want to feed him.  At that time he nipped at her hand and she pulled it back so the tooth scratched her hand.  Patient's Tdap is up-to-date.  Patient states she is extremely afraid of needles and went to urgent care and they told her they have to put the rabies medication into the wound.  Came here hoping that we could do just 1 shot.      Physical Exam   Triage Vital Signs: ED Triage Vitals  Enc Vitals Group     BP 02/16/23 1237 132/68     Pulse Rate 02/16/23 1237 (!) 101     Resp 02/16/23 1237 17     Temp 02/16/23 1237 98.2 F (36.8 C)     Temp src --      SpO2 02/16/23 1237 99 %     Weight 02/16/23 1233 99 lb 3.3 oz (45 kg)     Height 02/16/23 1233 5\' 4"  (1.626 m)     Head Circumference --      Peak Flow --      Pain Score 02/16/23 1233 0     Pain Loc --      Pain Edu? --      Excl. in [place]? --     Most recent vital signs: Vitals:   02/16/23 1237 02/16/23 1414  BP: 132/68 130/64  Pulse: (!) 101 95  Resp: 17 16  Temp: 98.2 F (36.8 C) 98 F (36.7 C)  SpO2: 99% 99%     General: Awake, no distress.   CV:  Good peripheral perfusion. regular rate and  rhythm Resp:  Normal effort.  Abd:  No distention.   Other:  Left hand with linear abrasion noted, no pus or drainage   ED Results / Procedures / Treatments   Labs (all labs ordered are listed, but only abnormal results are displayed) Labs Reviewed - No data to  display   EKG     RADIOLOGY     PROCEDURES:   Procedures   MEDICATIONS ORDERED IN ED: Medications - No data to display   IMPRESSION / MDM / [place] / ED COURSE  I reviewed the triage vital signs and the nursing notes.                              Differential diagnosis includes, but is not limited to, cat bite, cellulitis, wound infection  Patient's presentation is most consistent with acute, uncomplicated illness.   Contacted animal control.  They will be able to go out and set a trap for the cat.  I feel that the patient will be able to identify and catch the cat as it has been around her house for 2 weeks.  Told her to put some food out for it and it would come for the food and she  could put it in a cat carrier or could go into the animal control trap.  However if the cat shows signs of rabies or is not caught within 1 week she will need to return for rabies vaccinations.  She was advised that it is not just 1 vaccine.  Explained to her is multiple shots and multiple visits to the emergency department.  Patient is in agreement treatment plan at this time.  She was discharged stable condition.  He was given a prescription for doxycycline and Flagyl as she is allergic to penicillins      FINAL CLINICAL IMPRESSION(S) / ED DIAGNOSES   Final diagnoses:  Cat bite, initial encounter     Rx / DC Orders   ED Discharge Orders          Ordered    doxycycline (VIBRA-TABS) 100 MG tablet  2 times daily        02/16/23 1402    metroNIDAZOLE (FLAGYL) 500 MG tablet  3 times daily        02/16/23 1402             Note:  This document was prepared using Dragon voice recognition software and may include unintentional dictation errors.    Versie Starks, PA-C 02/16/23 1601    Blake Divine, MD 02/17/23 [phone]

## 2023-02-16 NOTE — ED Notes (Signed)
RN called [employer] to get in contact with animal control due to office being closed. Per police desk staff they will return call back.

## 2023-02-16 NOTE — ED Triage Notes (Signed)
Pt reports was bit by a cat yesterday. Pt reports went to UC and the rabies vaccine they were going to give her was multiple shots around the wound. Pt reports she is scared of needles and so they sent her here so she could get just one shot.

## 2023-02-16 NOTE — ED Notes (Signed)
Pt discharge to home. Pt VSS, GCS 15, NAD. Pt verbalized understanding of discharge instructions with no additional questions at this time.  

## 2023-02-16 NOTE — Discharge Instructions (Signed)
Have animal control take the cat to observe it for 1 week.  If the cat shows signs of rabies that is when you will need a rabies vaccine.  At this time you do not need it as this was a provoked attack and the cat appears to look well. Return as needed Take the antibiotics as prescribed.

## 2023-02-17 ENCOUNTER — Telehealth: Payer: Self-pay

## 2023-02-17 NOTE — Telephone Encounter (Signed)
Per chart review pt was seen at [facility] ED on 02/16/23. Sending note to Dr Silvio Pate and Silvio Pate pool.

## 2023-02-17 NOTE — Telephone Encounter (Signed)
[place] Night - Client TELEPHONE ADVICE RECORD [facility] Patient Name: Shari Graham Gender: Female DOB: 1960-12-24 Age: 62 Y 28 M 17 D Return Phone Number: [phone] (Primary) Address: City/ State/ Zip: [place]  [address] Client [place] [facility] Night - Client Client Site [place] Provider Viviana Simpler- MD Contact Type Call Who Is Calling Patient / Member / Family / Caregiver Call Type Triage / Clinical Relationship To Patient Self Return Phone Number [phone] (Primary) Chief Complaint Animal Bite Reason for Call Symptomatic / Request for [place] states her s/s a stray cat bit her hand. [place] in [place] Translation No Nurse Assessment Nurse: Carlis Abbott, RN, Estill Bamberg Date/Time (Eastern Time): 02/15/2023 7:46:17 PM Confirm and document reason for call. If symptomatic, describe symptoms. ---Caller states her s/s a stray cat bit her hand. It did break the skin. It is the left hand. This happened today. There is no bleeding now. Does the patient have any new or worsening symptoms? ---Yes Will a triage be completed? ---Yes Related visit to physician within the last 2 weeks? ---No Does the PT have any chronic conditions? (i.e. diabetes, asthma, this includes High risk factors for pregnancy, etc.) ---No Is this a behavioral health or substance abuse call? ---No Guidelines Guideline Title Affirmed Question Affirmed Notes Nurse Date/Time (Eastern Time) Animal Bite [1] Any break in skin from BITE (e.g., cut, puncture or scratch) AND [2] PET animal (e.g., dog, cat, or ferret) at risk for RABIES (e.g., sick, stray, unprovoked bite, developing country) [place], RN, Estill Bamberg 02/15/2023 7:49:21 PM PLEASE NOTE: All timestamps contained within this report are represented as [place] Standard Time. CONFIDENTIALTY NOTICE:  This fax transmission is intended only for the addressee. It contains information that is legally privileged, confidential or otherwise protected from use or disclosure. If you are not the intended recipient, you are strictly prohibited from reviewing, disclosing, copying using or disseminating any of this information or taking any action in reliance on or regarding this information. If you have received this fax in error, please notify us immediately by telephone so that we can arrange for its return to [place]. Phone: [phone], Toll-Free: [phone], Fax: [phone] Page: 2 of 2 Call Id: [id number] [place]. Time Eilene Ghazi Time) Disposition Final User 02/15/2023 8:05:56 PM Go to ED Now Yes Carlis Abbott, RN, Estill Bamberg Final Disposition 02/15/2023 8:05:56 PM Go to ED Now Yes Carlis Abbott, RN, Shelly Coss Disagree/Comply Disagree Caller Understands Yes PreDisposition InappropriateToAsk Care Advice Given Per Guideline GO TO ED NOW: * You need to be seen in the Emergency Department. * Go to the ED at ___________ [place] now. Drive carefully. CARE ADVICE given per Animal Bite (Adult) guideline. Comments User: Gabriel Earing, RN Date/Time (Eastern Time): 02/15/2023 8:05:53 PM States that she is just going to see about going to documented UC tomorrow instead. She doesn't want to go to the ER at all Referrals [place] REFUSED GO TO [place]

## 2023-02-17 NOTE — Telephone Encounter (Signed)
She got appropriate antibiotic treatment Will recheck prn only

## 2023-02-17 NOTE — Transitions of Care (Post Inpatient/ED Visit) (Signed)
   02/17/2023  Name: Shari Graham MRN: [id number] DOB: 1961/07/21  Today's TOC FU Call Status: Today's TOC FU Call Status:: Successful TOC FU Call Competed TOC FU Call Complete Date: 02/17/23  Transition Care Management Follow-up Telephone Call Date of Discharge: 02/16/23 Discharge Facility[facility]) Type of Discharge: Emergency Department Reason for ED Visit: Other: (cat bite) How have you been since you were released from the hospital?: Same Any questions or concerns?: No  Items Reviewed: Did you receive and understand the discharge instructions provided?: Yes Medications obtained and verified?: Yes (Medications Reviewed) Any new allergies since your discharge?: No Dietary orders reviewed?: NA Do you have support at home?: No  Home Care and Equipment/Supplies: [place] Ordered?: NA Any new equipment or medical supplies ordered?: NA  Functional Questionnaire: Do you need assistance with bathing/showering or dressing?: No Do you need assistance with meal preparation?: No Do you need assistance with eating?: No Do you have difficulty maintaining continence: No Do you need assistance with getting out of bed/getting out of a chair/moving?: No Do you have difficulty managing or taking your medications?: No  Follow up appointments reviewed: PCP Follow-up appointment confirmed?: NA (Patient declined) [facility] Follow-up appointment confirmed?: NA Do you need transportation to your follow-up appointment?: No Do you understand care options if your condition(s) worsen?: Yes-patient verbalized understanding    SIGNATURE.Francella Solian, CMA

## 2023-02-19 ENCOUNTER — Ambulatory Visit: Payer: BC Managed Care – PPO | Admitting: Internal Medicine

## 2023-02-19 ENCOUNTER — Encounter: Payer: Self-pay | Admitting: Internal Medicine

## 2023-02-19 VITALS — BP 110/70 | HR 94 | Temp 97.3°F | Ht 64.0 in | Wt 101.0 lb

## 2023-02-19 DIAGNOSIS — S61452A Open bite of left hand, initial encounter: Secondary | ICD-10-CM | POA: Diagnosis not present

## 2023-02-19 DIAGNOSIS — R519 Headache, unspecified: Secondary | ICD-10-CM | POA: Diagnosis not present

## 2023-02-19 DIAGNOSIS — G8929 Other chronic pain: Secondary | ICD-10-CM | POA: Diagnosis not present

## 2023-02-19 DIAGNOSIS — T148XXA Other injury of unspecified body region, initial encounter: Secondary | ICD-10-CM | POA: Insufficient documentation

## 2023-02-19 MED ORDER — ONDANSETRON HCL 4 MG PO TABS: 4.0000 mg | ORAL_TABLET | Freq: Three times a day (TID) | ORAL | 3 refills | Status: AC | PRN

## 2023-02-19 NOTE — Telephone Encounter (Signed)
February 19, 2023 Shari Carbon, MD  to Me  Shari Graham     02/19/23 10:58 AM Okay to put those antibiotics on intolerance list Will have limited options now Not sure if the flagyl alone was the problem--or doxy also I will check today and then discuss other options  Per instruction by Dr Silvio Pate added doxycycline and flagyl to allergies/contraindications list.

## 2023-02-19 NOTE — Assessment & Plan Note (Signed)
Gets nausea Needs some zofran for prn use

## 2023-02-19 NOTE — Telephone Encounter (Signed)
Pt called in and pt seen at ED on 02/16/23 for stray cat bit and pt given doxycycline and Flagyl. Pt said started abx on 02/17/23. On 02/18/23 abx caused shaking,nausea and vomiting.pt has not taken abx today and wants different abx and zofran for nausea. Pt said the hand does not look bad today. Pt said today she has not taken abxs. Pt said the cat was picked up by animal control and will quarantine cat for 10 days and then if no sign of rabies the cat will be released back into the neighborhood. Pt said she does not want the cat released back in her neighborhood.and wants to know what I can do about that. I told pt she would need to talk with animal control about that. Pt scheduled appt with Dr Silvio Pate 02/19/23 at 11:45 to be evaluated and pt wants both abx put on allergy and contraindications list. I advised pt will wait for Dr Everardo Beals eval and then med can be placed on contraindications list if Oked by Dr Silvio Pate. Pt voiced understanding and will be appt this morning. Pt was appreciative at end of call. Sending note to Dr Silvio Pate and Silvio Pate pool.
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
## 2023-02-19 NOTE — Assessment & Plan Note (Signed)
Scratch from cat tooth---doesn't look like a true bite (no puncture) Intolerant of the antibiotics---fortunately doesn't need other antibiotics

## 2023-03-12 ENCOUNTER — Ambulatory Visit (INDEPENDENT_AMBULATORY_CARE_PROVIDER_SITE_OTHER): Payer: BC Managed Care – PPO | Admitting: Internal Medicine

## 2023-03-12 ENCOUNTER — Encounter: Payer: Self-pay | Admitting: Internal Medicine

## 2023-03-12 VITALS — BP 112/70 | HR 94 | Temp 97.3°F | Ht 64.0 in | Wt 101.0 lb

## 2023-03-12 DIAGNOSIS — F411 Generalized anxiety disorder: Secondary | ICD-10-CM | POA: Diagnosis not present

## 2023-03-12 DIAGNOSIS — M159 Polyosteoarthritis, unspecified: Secondary | ICD-10-CM

## 2023-03-12 DIAGNOSIS — R519 Headache, unspecified: Secondary | ICD-10-CM

## 2023-03-12 DIAGNOSIS — J449 Chronic obstructive pulmonary disease, unspecified: Secondary | ICD-10-CM | POA: Diagnosis not present

## 2023-03-12 DIAGNOSIS — Z1211 Encounter for screening for malignant neoplasm of colon: Secondary | ICD-10-CM

## 2023-03-12 DIAGNOSIS — G8929 Other chronic pain: Secondary | ICD-10-CM

## 2023-03-12 DIAGNOSIS — E039 Hypothyroidism, unspecified: Secondary | ICD-10-CM

## 2023-03-12 DIAGNOSIS — Z0000 Encounter for general adult medical examination without abnormal findings: Secondary | ICD-10-CM | POA: Diagnosis not present

## 2023-03-12 NOTE — Assessment & Plan Note (Signed)
Not much symptoms now Discussed cigarette cessation again
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
## 2023-03-12 NOTE — Patient Instructions (Signed)
Please try the diclofenac gel on your knees and shoulders You can also try acetaminophen arthritis 650mg  2-3 times a day

## 2023-03-12 NOTE — Assessment & Plan Note (Signed)
Ongoing symptoms Hasn't done weill with meds

## 2023-03-12 NOTE — Assessment & Plan Note (Signed)
Discussed cigarette cessation Not much exercise---discussed Recent mammogram Will try FIT again Pap per gyn

## 2023-03-12 NOTE — Assessment & Plan Note (Signed)
Labs 6 months ago were okay

## 2023-03-12 NOTE — Assessment & Plan Note (Signed)
Discussed trying the diclofenac she has Can also try tylenol

## 2023-03-12 NOTE — Assessment & Plan Note (Signed)
Works with Dr Shah---nortriptyline and topiramate

## 2023-04-22 DIAGNOSIS — G43019 Migraine without aura, intractable, without status migrainosus: Secondary | ICD-10-CM | POA: Diagnosis not present

## 2023-04-22 DIAGNOSIS — M542 Cervicalgia: Secondary | ICD-10-CM | POA: Diagnosis not present

## 2023-04-22 DIAGNOSIS — G4440 Drug-induced headache, not elsewhere classified, not intractable: Secondary | ICD-10-CM | POA: Diagnosis not present

## 2023-07-06 ENCOUNTER — Other Ambulatory Visit: Payer: Self-pay | Admitting: Internal Medicine

## 2023-07-30 ENCOUNTER — Encounter: Payer: Self-pay | Admitting: Internal Medicine

## 2023-07-30 ENCOUNTER — Ambulatory Visit: Payer: BC Managed Care – PPO | Admitting: Internal Medicine

## 2023-07-30 VITALS — BP 118/86 | HR 78 | Temp 97.5°F | Ht 64.0 in | Wt 101.0 lb

## 2023-07-30 DIAGNOSIS — E039 Hypothyroidism, unspecified: Secondary | ICD-10-CM

## 2023-07-30 DIAGNOSIS — Z23 Encounter for immunization: Secondary | ICD-10-CM

## 2023-07-30 DIAGNOSIS — N951 Menopausal and female climacteric states: Secondary | ICD-10-CM | POA: Diagnosis not present

## 2023-07-30 DIAGNOSIS — R5383 Other fatigue: Secondary | ICD-10-CM

## 2023-07-30 LAB — COMPREHENSIVE METABOLIC PANEL
ALT: 14 U/L (ref 0–35)
AST: 23 U/L (ref 0–37)
Albumin: 4.3 g/dL (ref 3.5–5.2)
Alkaline Phosphatase: 66 U/L (ref 39–117)
BUN: 13 mg/dL (ref 6–23)
CO2: 23 mEq/L (ref 19–32)
Calcium: 9.3 mg/dL (ref 8.4–10.5)
Chloride: 109 mEq/L (ref 96–112)
Creatinine, Ser: 0.78 mg/dL (ref 0.40–1.20)
GFR: 80.55 mL/min (ref >60.00)
Glucose, Bld: 94 mg/dL (ref 70–99)
Potassium: 3.4 mEq/L — ABNORMAL LOW (ref 3.5–5.1)
Sodium: 142 mEq/L (ref 135–145)
Total Bilirubin: 0.6 mg/dL (ref 0.2–1.2)
Total Protein: 6.5 g/dL (ref 6.0–8.3)

## 2023-07-30 LAB — SEDIMENTATION RATE: Sed Rate: 1 mm/hr (ref 0–30)

## 2023-07-30 LAB — CBC
HCT: 41.8 % (ref 36.0–46.0)
Hemoglobin: 14 g/dL (ref 12.0–15.0)
MCHC: 33.5 g/dL (ref 30.0–36.0)
MCV: 92.6 fl (ref 78.0–100.0)
Platelets: 225 10*3/uL (ref 150.0–400.0)
RBC: 4.52 Mil/uL (ref 3.87–5.11)
RDW: 12.6 % (ref 11.5–15.5)
WBC: 7.5 10*3/uL (ref 4.0–10.5)

## 2023-07-30 LAB — T4, FREE: Free T4: 1.22 ng/dL (ref 0.60–1.60)

## 2023-07-30 LAB — TSH: TSH: 0.06 u[IU]/mL — ABNORMAL LOW (ref 0.35–5.50)

## 2023-07-30 LAB — VITAMIN D 25 HYDROXY (VIT D DEFICIENCY, FRACTURES): VITD: 16.13 ng/mL — ABNORMAL LOW (ref 30.00–100.00)

## 2023-07-30 LAB — VITAMIN B12: Vitamin B-12: 115 pg/mL — ABNORMAL LOW (ref 211–911)

## 2023-07-30 MED ORDER — TRIAMCINOLONE ACETONIDE 0.1 % EX CREA: 1.0000 | TOPICAL_CREAM | Freq: Two times a day (BID) | CUTANEOUS | 1 refills | Status: DC | PRN

## 2023-07-30 NOTE — Assessment & Plan Note (Signed)
Seems to be multifactorial Sleep problems Work burnout No clear physical problem--but will check labs Counseled about work issues

## 2023-07-30 NOTE — Patient Instructions (Signed)
Please go back to the full estradiol tablet daily

## 2023-07-30 NOTE — Assessment & Plan Note (Signed)
Will recheck

## 2023-07-30 NOTE — Addendum Note (Signed)
Addended by: Eual Fines on: 07/30/2023 11:23 AM   Modules accepted: Orders
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
## 2023-07-30 NOTE — Assessment & Plan Note (Signed)
Seems to be worse---and affecting sleep Asked her to increase the estradiol back to 1mg  daily

## 2023-07-31 ENCOUNTER — Other Ambulatory Visit: Payer: Self-pay | Admitting: Internal Medicine

## 2023-07-31 DIAGNOSIS — E538 Deficiency of other specified B group vitamins: Secondary | ICD-10-CM

## 2023-08-01 ENCOUNTER — Other Ambulatory Visit: Payer: Self-pay

## 2023-08-01 MED ORDER — LEVOTHYROXINE SODIUM 75 MCG PO TABS: 75.0000 ug | ORAL_TABLET | Freq: Every day | ORAL | 3 refills | Status: DC

## 2023-08-03 ENCOUNTER — Other Ambulatory Visit: Payer: Self-pay | Admitting: Internal Medicine

## 2023-08-03 DIAGNOSIS — E039 Hypothyroidism, unspecified: Secondary | ICD-10-CM

## 2023-08-14 IMAGING — MG DIGITAL DIAGNOSTIC BILAT W/ TOMO W/ CAD
8 of 15 series · 8 of 40 positions shown · non-contrast
Comparison: Previous exam(s).

CLINICAL DATA: Palpable abnormality in the LEFT breast noted 2
months ago and confirmed by her provider on physical exam. Family
history of breast cancer diagnosed in her paternal grandmother.

EXAM:
DIGITAL DIAGNOSTIC BILATERAL MAMMOGRAM WITH TOMOSYNTHESIS AND CAD;
ULTRASOUND LEFT BREAST LIMITED
TECHNIQUE: Bilateral digital diagnostic mammography and breast tomosynthesis
was performed. The images were evaluated with computer-aided
detection.; Targeted ultrasound examination of the left breast was
performed.

[L MLO synth-2D]
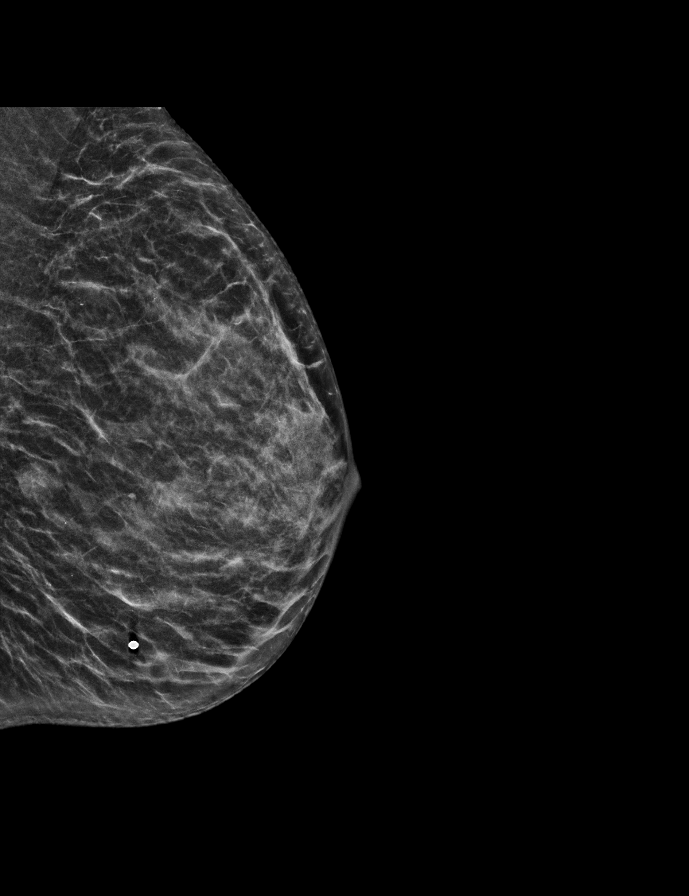

[R CC synth-2D]
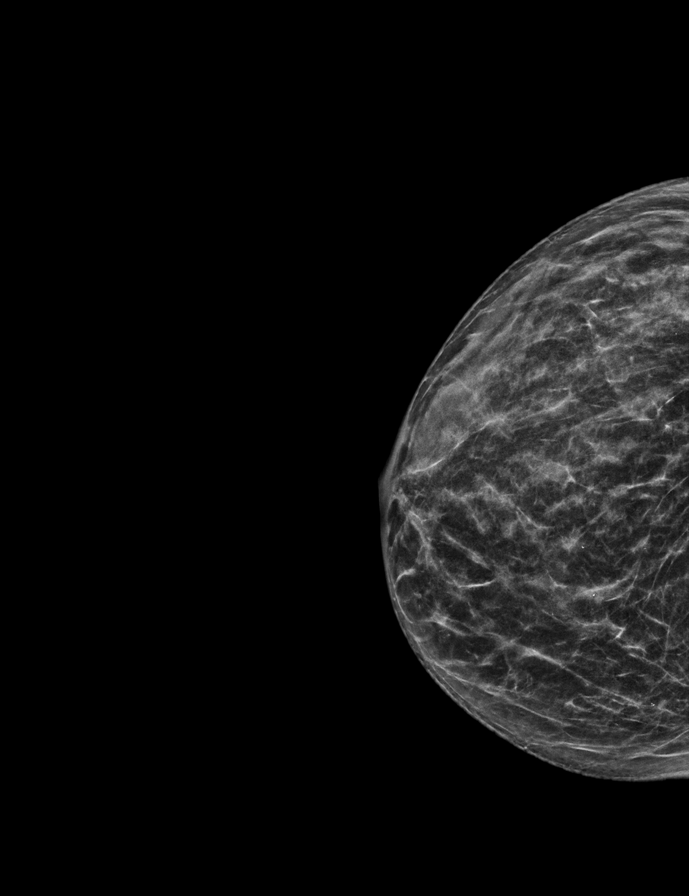

[R MLO synth-2D (1 of 2)]
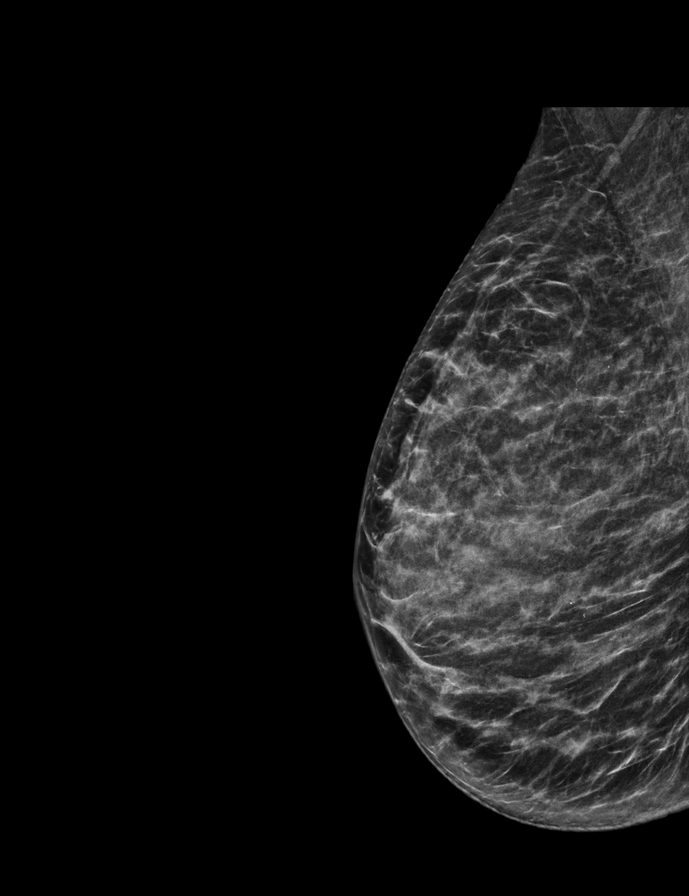

[R MLO synth-2D (2 of 2)]
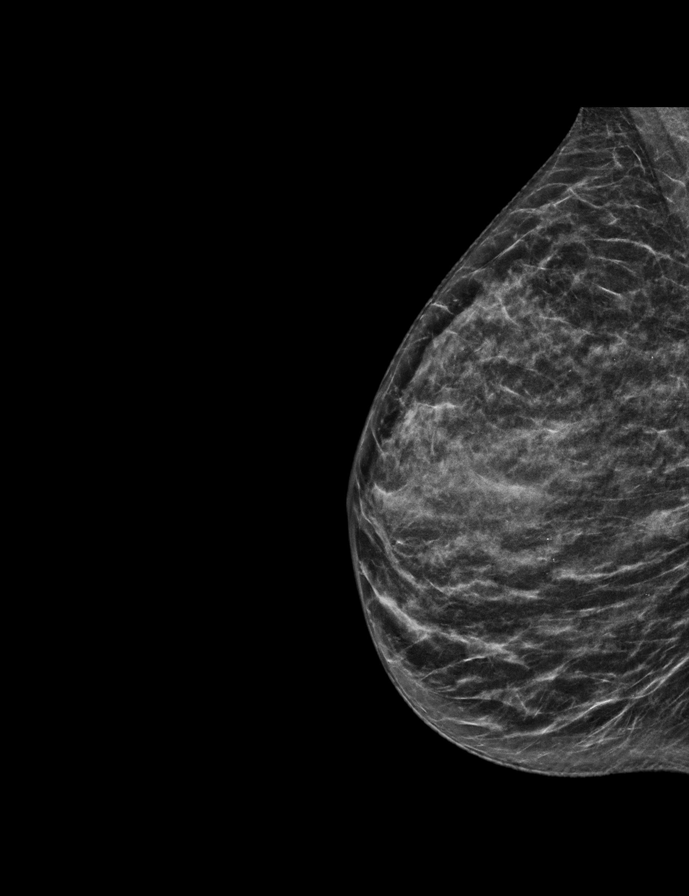

[L XCCL synth-2D]
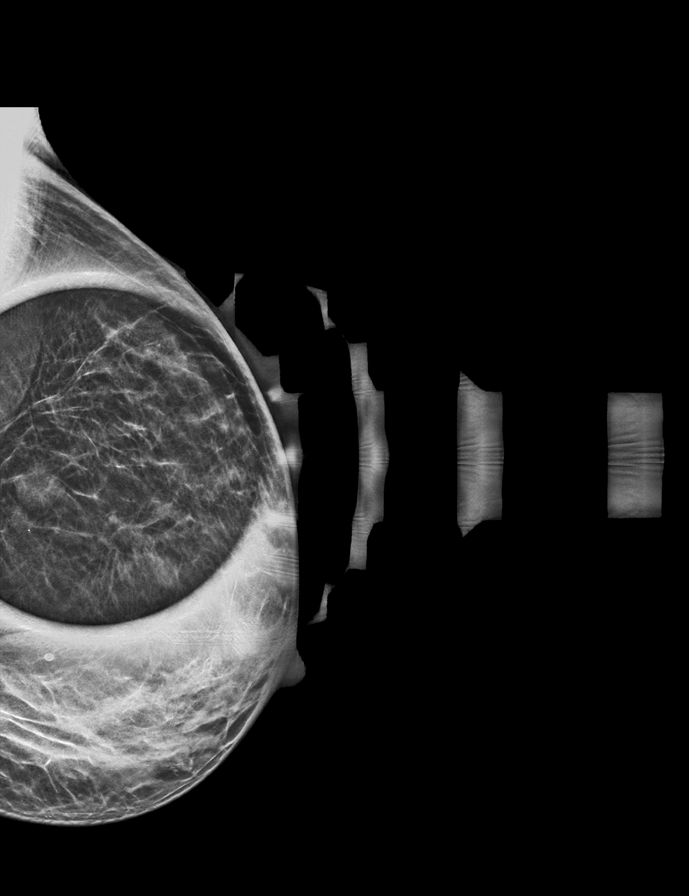

[L CC synth-2D (1 of 2)]
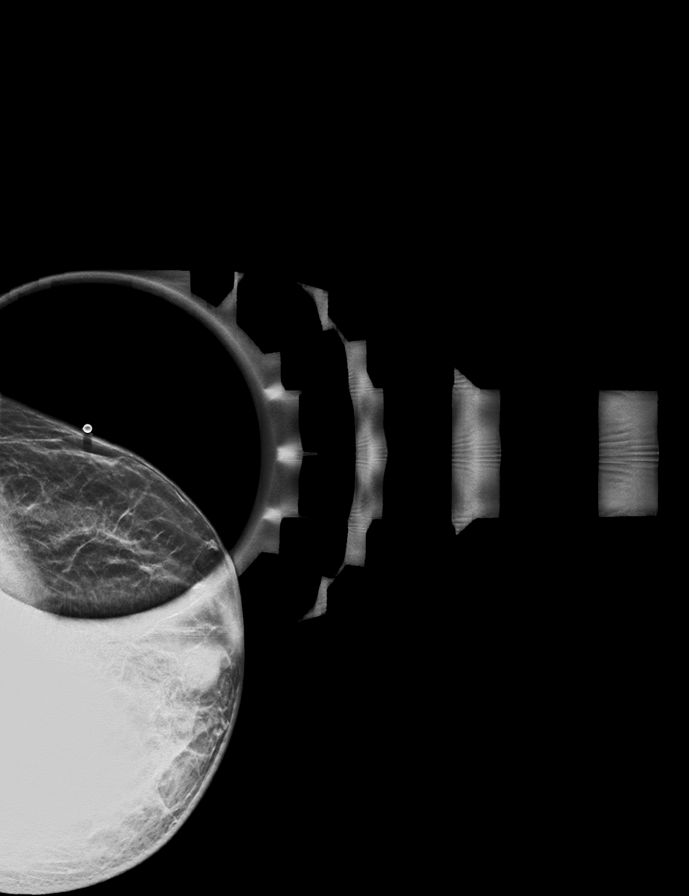

[L CC synth-2D (2 of 2)]
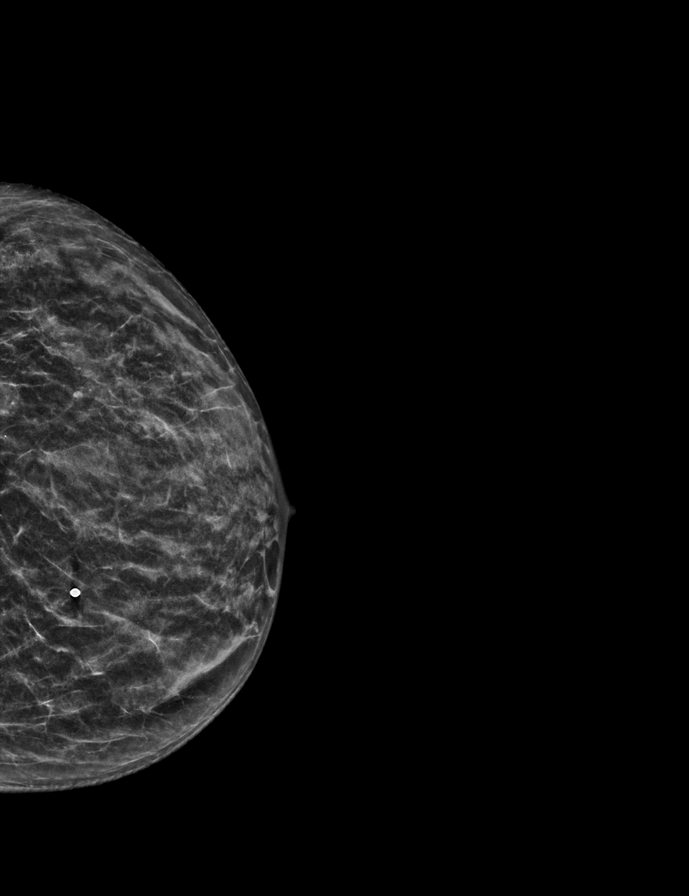

[R MLO tomo · tomo slice 32/47.0]
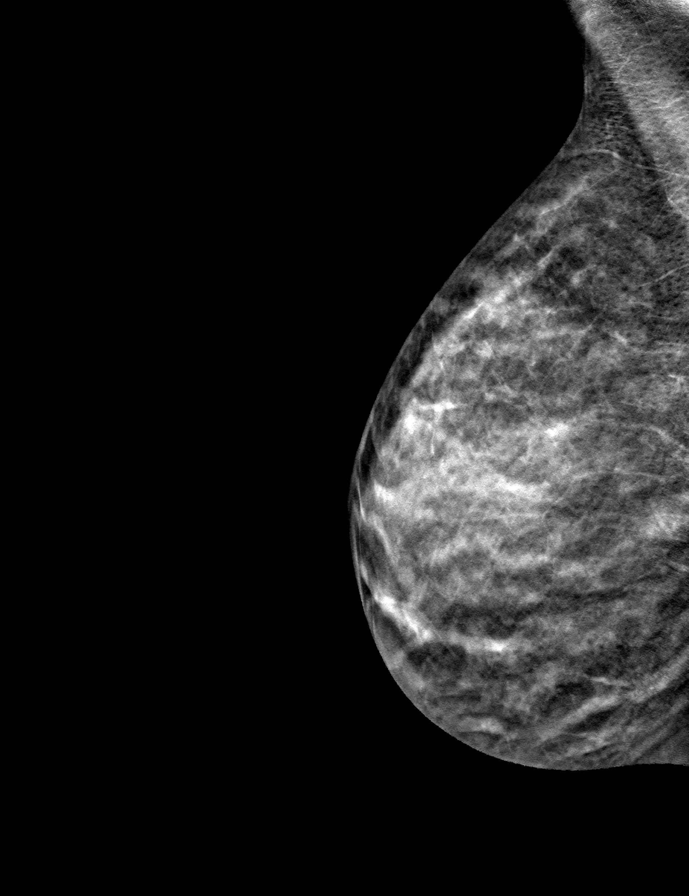

[8 of 40 positions shown; findings below may reference images not displayed]

ACR Breast Density Category c: The breast tissue is heterogeneously
dense, which may obscure small masses.
FINDINGS: RIGHT breast is negative.

Within the LOWER OUTER QUADRANT of the LEFT breast a partially
obscured oval mass is confirmed on spot compression views. Spot
tangential view is performed in the area patient's concern in shows
normal appearing fibroglandular tissue without mass.

On physical exam, patient shows me the area of her concern, in the
LOWER OUTER QUADRANT of the LEFT breast, slightly different than the
location of the BB placed at the time of the mammogram. In this
region I palpate a discrete mobile smooth oval mass, approximately 4
o'clock location.

Targeted ultrasound is performed, showing a simple cyst in the 4
o'clock location of the LEFT breast 4 centimeters from the nipple
measuring 1.2 x 1.4 x 0.7 centimeters and correlating with the
palpable finding. In the 3 o'clock location of the LEFT breast,
dense fibroglandular tissue and scattered cysts are noted.
IMPRESSION: No mammographic or ultrasound evidence for malignancy. Palpable
abnormality in the LEFT breast is a benign cyst. We discussed the
option of ultrasound-guided aspiration, but this does not appear to
be needed for symptomatic relief at this time.

RECOMMENDATION:
Screening mammogram in one year.(Code:[id number])

I have discussed the findings and recommendations with the patient.
If applicable, a reminder letter will be sent to the patient
regarding the next appointment.

BI-RADS CATEGORY  2: Benign.

## 2023-09-04 ENCOUNTER — Other Ambulatory Visit (INDEPENDENT_AMBULATORY_CARE_PROVIDER_SITE_OTHER): Payer: BC Managed Care – PPO

## 2023-09-04 ENCOUNTER — Telehealth: Payer: Self-pay | Admitting: Internal Medicine

## 2023-09-04 DIAGNOSIS — E039 Hypothyroidism, unspecified: Secondary | ICD-10-CM

## 2023-09-04 DIAGNOSIS — E538 Deficiency of other specified B group vitamins: Secondary | ICD-10-CM

## 2023-09-04 LAB — T4, FREE: Free T4: 0.78 ng/dL (ref 0.60–1.60)

## 2023-09-04 LAB — TSH: TSH: 1.23 u[IU]/mL (ref 0.35–5.50)

## 2023-09-04 LAB — VITAMIN B12: Vitamin B-12: 275 pg/mL (ref 211–911)

## 2023-09-04 NOTE — Telephone Encounter (Signed)
Patient wanted to make Dr. Alphonsus Sias aware that the B12 supplements she is currently taking are upsetting her stomach. Patient would like to do B12 injections if possible, and asked if Dr. Alphonsus Sias could order these for her. PT says she deals with constant exhaustion and wanted some relief

## 2023-09-04 NOTE — Telephone Encounter (Signed)
Tried to call pt but VM was full.

## 2023-09-08 ENCOUNTER — Other Ambulatory Visit: Payer: Self-pay | Admitting: Internal Medicine

## 2023-09-08 NOTE — Telephone Encounter (Signed)
Spoke to pt. Set up nurse visit

## 2023-09-08 NOTE — Progress Notes (Signed)
Patient not able to tolerate oral B12  Will set up with monthly injections of B12 IM

## 2023-09-08 NOTE — Telephone Encounter (Signed)
Patient called in with some questions regarding her B12 and some vitamins.

## 2023-09-30 NOTE — Progress Notes (Unsigned)
Per orders of Audria Nine, NP, injection of B-12 given by Leonor Liv in left deltoid. Patient tolerated injection well. Patient will make appointment for 1 month.

## 2023-10-01 ENCOUNTER — Ambulatory Visit (INDEPENDENT_AMBULATORY_CARE_PROVIDER_SITE_OTHER): Payer: BC Managed Care – PPO

## 2023-10-01 DIAGNOSIS — E538 Deficiency of other specified B group vitamins: Secondary | ICD-10-CM

## 2023-10-01 MED ORDER — CYANOCOBALAMIN 1000 MCG/ML IJ SOLN
1000.0000 ug | Freq: Once | INTRAMUSCULAR | Status: AC
  Administered 2023-10-01: 1000 ug via INTRAMUSCULAR

## 2023-10-05 NOTE — Progress Notes (Unsigned)
    Egon Dittus T. Arlyce Circle, MD, CAQ Sports Medicine [facility] [address] [place], [address]  Phone: [phone]  FAX: [phone]  Shari Graham - 62 y.o. female  MRN [id number]  Date of Birth: November 18, 1961  Date: 10/06/2023  PCP: Karie Schwalbe, MD  Referral: Karie Schwalbe, MD  No chief complaint on file.  Subjective:   Shari Graham is a 62 y.o. very pleasant female patient with There is no height or weight on file to calculate BMI. who presents with the following:  She is very pleasant patient presents with some ongoing shoulder pain.  Over a year ago, she did see some physicians at [facility] for ongoing right-sided shoulder pain and frozen shoulder.    Review of Systems is noted in the HPI, as appropriate  Objective:   There were no vitals taken for this visit.  GEN: No acute distress; alert,appropriate. PULM: Breathing comfortably in no respiratory distress PSYCH: Normally interactive.   Laboratory and Imaging Data:  Assessment and Plan:   ***

## 2023-10-06 ENCOUNTER — Ambulatory Visit: Payer: BC Managed Care – PPO | Admitting: Family Medicine

## 2023-10-06 ENCOUNTER — Encounter: Payer: Self-pay | Admitting: Family Medicine

## 2023-10-06 ENCOUNTER — Ambulatory Visit (INDEPENDENT_AMBULATORY_CARE_PROVIDER_SITE_OTHER)
Admission: RE | Admit: 2023-10-06 | Discharge: 2023-10-06 | Disposition: A | Discharge status: Home / Self Care | Payer: BC Managed Care – PPO | Source: Ambulatory Visit | Attending: Family Medicine | Admitting: Family Medicine

## 2023-10-06 VITALS — BP 106/62 | HR 73 | Temp 97.1°F | Ht 64.0 in | Wt 102.8 lb

## 2023-10-06 DIAGNOSIS — M19012 Primary osteoarthritis, left shoulder: Secondary | ICD-10-CM | POA: Diagnosis not present

## 2023-10-06 DIAGNOSIS — M25512 Pain in left shoulder: Secondary | ICD-10-CM | POA: Diagnosis not present

## 2023-10-06 DIAGNOSIS — M7502 Adhesive capsulitis of left shoulder: Secondary | ICD-10-CM

## 2023-10-06 MED ORDER — TRIAMCINOLONE ACETONIDE 40 MG/ML IJ SUSP
40.0000 mg | Freq: Once | INTRAMUSCULAR | Status: AC
  Administered 2023-10-06: 40 mg via INTRA_ARTICULAR

## 2023-10-23 DIAGNOSIS — E569 Vitamin deficiency, unspecified: Secondary | ICD-10-CM | POA: Diagnosis not present

## 2023-10-23 DIAGNOSIS — G43019 Migraine without aura, intractable, without status migrainosus: Secondary | ICD-10-CM | POA: Diagnosis not present

## 2023-11-04 NOTE — Progress Notes (Unsigned)
    Trayvon Trumbull T. Glenis Musolf, MD, CAQ Sports Medicine [facility] [address] [place], [address]  Phone: [phone]  FAX: [phone]  Shari Graham - 62 y.o. female  MRN [id number]  Date of Birth: 1961-01-14  Date: 11/05/2023  PCP: Karie Schwalbe, MD  Referral: Karie Schwalbe, MD  No chief complaint on file.  Subjective:   Shari Graham is a 62 y.o. very pleasant female patient with There is no height or weight on file to calculate BMI. who presents with the following:  The patient is here in follow-up.  I saw her on October 06, 2023 with a severe frozen shoulder.  I recall this case quite well, and she had markedly abnormal range of motion.    Review of Systems is noted in the HPI, as appropriate  Objective:   There were no vitals taken for this visit.  GEN: No acute distress; alert,appropriate. PULM: Breathing comfortably in no respiratory distress PSYCH: Normally interactive.   Laboratory and Imaging Data:  Assessment and Plan:   ***

## 2023-11-05 ENCOUNTER — Ambulatory Visit: Payer: BC Managed Care – PPO | Admitting: Family Medicine

## 2023-11-05 ENCOUNTER — Encounter: Payer: Self-pay | Admitting: Family Medicine

## 2023-11-05 VITALS — BP 130/80 | HR 89 | Temp 97.9°F | Ht 64.0 in | Wt 103.2 lb

## 2023-11-05 DIAGNOSIS — M7502 Adhesive capsulitis of left shoulder: Secondary | ICD-10-CM

## 2023-11-05 MED ORDER — TRIAMCINOLONE ACETONIDE 40 MG/ML IJ SUSP
40.0000 mg | Freq: Once | INTRAMUSCULAR | Status: AC
  Administered 2023-11-05: 40 mg via INTRA_ARTICULAR

## 2023-12-17 ENCOUNTER — Ambulatory Visit: Payer: BC Managed Care – PPO | Admitting: Internal Medicine

## 2023-12-17 ENCOUNTER — Encounter: Payer: Self-pay | Admitting: Internal Medicine

## 2023-12-17 VITALS — BP 112/70 | HR 84 | Temp 98.3°F | Ht 64.0 in | Wt 101.0 lb

## 2023-12-17 DIAGNOSIS — E538 Deficiency of other specified B group vitamins: Secondary | ICD-10-CM | POA: Diagnosis not present

## 2023-12-17 DIAGNOSIS — L989 Disorder of the skin and subcutaneous tissue, unspecified: Secondary | ICD-10-CM

## 2023-12-17 MED ORDER — CYANOCOBALAMIN 1000 MCG/ML IJ SOLN
1000.0000 ug | Freq: Once | INTRAMUSCULAR | Status: AC
  Administered 2023-12-17: 1000 ug via INTRAMUSCULAR

## 2023-12-17 NOTE — Addendum Note (Signed)
 Addended by: Curt Dover I on: 12/17/2023 11:04 AM   Modules accepted: Level of Service

## 2023-12-17 NOTE — Addendum Note (Signed)
 Addended by: Franne Ivory on: 12/17/2023 01:12 PM   Modules accepted: Orders

## 2023-12-17 NOTE — Assessment & Plan Note (Signed)
Due for injection today

## 2023-12-17 NOTE — Assessment & Plan Note (Signed)
 Highly suspicious  Will try to get urgent appointment with dermatology
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
## 2024-01-05 ENCOUNTER — Telehealth: Payer: Self-pay | Admitting: Internal Medicine

## 2024-01-05 ENCOUNTER — Ambulatory Visit: Payer: Self-pay | Admitting: Internal Medicine

## 2024-01-05 NOTE — Telephone Encounter (Signed)
Left detailed message on VM per DPR. 

## 2024-01-05 NOTE — Telephone Encounter (Signed)
Copied from CRM [id number]. 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  Chief Complaint: pain/referral Symptoms: painful tumor under left knee, increased in size, increased in pain, referred has been submitted for dermatology and noone has contacted pt with anyupdates Frequency: 3 weeks and worsening Pertinent Negatives: Patient denies drainage Disposition: [] ED /[] Urgent Care (no appt availability in office) / [] Appointment(In office/virtual)/ []  [facility]/ [] Home Care/ [] Refused Recommended Disposition /[] [place] [facility]/ [x]  Follow-up with PCP Additional Notes: pt states this area is larger, has redness, more pain - around 4/10, more uncomfortable .  Referral submitted approx 3 weeks and would like for someone to reach out asap to inform of status. Please request call back.  Answer Assessment - Initial Assessment Questions 1. ONSET: "When did the pain start?"      At leaset 3 weeks ago 2. LOCATION: "Where is the pain located?"      Tumor located under left knee  3. PAIN: "How bad is the pain?"    (Scale 1-10; or mild, moderate, severe)   -  MILD (1-3): doesn't interfere with normal activities    -  MODERATE (4-7): interferes with normal activities (e.g., work or school) or awakens from sleep, limping    -  SEVERE (8-10): excruciating pain, unable to do any normal activities, unable to walk     4/10 4. WORK OR EXERCISE: "Has there been any recent work or exercise that involved this part of the body?"      no 5. CAUSE: "What do you think is causing the leg pain?"     tumor 6. OTHER SYMPTOMS: "Do you have any other symptoms?" (e.g., chest pain, back pain, breathing difficulty, swelling, rash, fever, numbness, weakness)     no 7.  PREGNANCY: "Is there any chance you are pregnant?" "When was your last menstrual period?"     N/a  Protocols used: Leg Pain-A-AH

## 2024-01-05 NOTE — Telephone Encounter (Signed)
Copied from CRM [id number]. Topic: Referral - Status >> Jan 05, 2024 10:32 AM Samuel Jester B wrote: Reason for CRM: Pt stated that she is needing a callback as soon as possible regarding a tumor on her left leg located under her knee, she said it has gotten bigger, can't stand long periods, and very uncomfortable. She was suppose to be seen at the Dermatologist office located on on [facility] and has not received an calls.

## 2024-01-07 NOTE — Telephone Encounter (Signed)
 The referral was faxed and they stated that once they received it in their new email faxing system they would review it and call to schedule. They stated that they just upgraded to a new email faxing system so referrals may be a little behind.  I called them this morning to follow up and left a message for the Referral Coordinator Camie to return my call regarding this referral. I will update as soon as I hear back from their office.  See referral for additional notes or updates as well.

## 2024-01-07 NOTE — Telephone Encounter (Signed)
 I was able to schedule the patient for this Friday                  01/09/2024 at 9am at [facility] location with Drenda Gentle  This was the first available at any of their locations.

## 2024-01-09 DIAGNOSIS — C44729 Squamous cell carcinoma of skin of left lower limb, including hip: Secondary | ICD-10-CM | POA: Diagnosis not present

## 2024-01-09 DIAGNOSIS — R208 Other disturbances of skin sensation: Secondary | ICD-10-CM | POA: Diagnosis not present

## 2024-01-09 DIAGNOSIS — D485 Neoplasm of uncertain behavior of skin: Secondary | ICD-10-CM | POA: Diagnosis not present

## 2024-01-21 ENCOUNTER — Ambulatory Visit: Payer: BC Managed Care – PPO

## 2024-01-27 ENCOUNTER — Ambulatory Visit (INDEPENDENT_AMBULATORY_CARE_PROVIDER_SITE_OTHER): Payer: BC Managed Care – PPO

## 2024-01-27 DIAGNOSIS — E538 Deficiency of other specified B group vitamins: Secondary | ICD-10-CM

## 2024-01-27 MED ORDER — CYANOCOBALAMIN 1000 MCG/ML IJ SOLN
1000.0000 ug | Freq: Once | INTRAMUSCULAR | Status: AC
  Administered 2024-01-27: 1000 ug via INTRAMUSCULAR

## 2024-01-27 NOTE — Progress Notes (Signed)
 Per orders of Dr. Eustaquio Boyden, injection of B-12 given by Melina Copa in left deltoid. Patient tolerated injection well. Patient will make appointment for 1 month.

## 2024-01-31 HISTORY — PX: SQUAMOUS CELL CARCINOMA EXCISION: SHX2433

## 2024-02-12 DIAGNOSIS — C44729 Squamous cell carcinoma of skin of left lower limb, including hip: Secondary | ICD-10-CM | POA: Diagnosis not present

## 2024-02-24 ENCOUNTER — Ambulatory Visit: Payer: BC Managed Care – PPO

## 2024-03-04 ENCOUNTER — Ambulatory Visit (INDEPENDENT_AMBULATORY_CARE_PROVIDER_SITE_OTHER): Admitting: *Deleted

## 2024-03-04 DIAGNOSIS — E538 Deficiency of other specified B group vitamins: Secondary | ICD-10-CM | POA: Diagnosis not present

## 2024-03-04 MED ORDER — CYANOCOBALAMIN 1000 MCG/ML IJ SOLN
1000.0000 ug | Freq: Once | INTRAMUSCULAR | Status: AC
  Administered 2024-03-04: 1000 ug via INTRAMUSCULAR

## 2024-03-04 NOTE — Progress Notes (Signed)
 [name], injection of B-12 given by [name] in right deltoid. Patient tolerated injection well. Patient will make appointment for 1 month.

## 2024-03-12 ENCOUNTER — Encounter: Payer: Self-pay | Admitting: Internal Medicine

## 2024-03-12 ENCOUNTER — Ambulatory Visit: Payer: BC Managed Care – PPO | Admitting: Internal Medicine

## 2024-03-12 VITALS — BP 112/78 | HR 70 | Temp 98.3°F | Ht 64.25 in | Wt 105.0 lb

## 2024-03-12 DIAGNOSIS — J449 Chronic obstructive pulmonary disease, unspecified: Secondary | ICD-10-CM

## 2024-03-12 DIAGNOSIS — F411 Generalized anxiety disorder: Secondary | ICD-10-CM | POA: Diagnosis not present

## 2024-03-12 DIAGNOSIS — Z0000 Encounter for general adult medical examination without abnormal findings: Secondary | ICD-10-CM | POA: Diagnosis not present

## 2024-03-12 DIAGNOSIS — E039 Hypothyroidism, unspecified: Secondary | ICD-10-CM

## 2024-03-12 DIAGNOSIS — E538 Deficiency of other specified B group vitamins: Secondary | ICD-10-CM

## 2024-03-12 LAB — COMPREHENSIVE METABOLIC PANEL WITH GFR
ALT: 4 U/L (ref 0–35)
AST: 11 U/L (ref 0–37)
Albumin: 4.7 g/dL (ref 3.5–5.2)
Alkaline Phosphatase: 57 U/L (ref 39–117)
BUN: 16 mg/dL (ref 6–23)
CO2: 25 meq/L (ref 19–32)
Calcium: 8.9 mg/dL (ref 8.4–10.5)
Chloride: 110 meq/L (ref 96–112)
Creatinine, Ser: 0.84 mg/dL (ref 0.40–1.20)
GFR: 73.38 mL/min (ref >60.00)
Glucose, Bld: 66 mg/dL — ABNORMAL LOW (ref 70–99)
Potassium: 3.3 meq/L — ABNORMAL LOW (ref 3.5–5.1)
Sodium: 144 meq/L (ref 135–145)
Total Bilirubin: 0.3 mg/dL (ref 0.2–1.2)
Total Protein: 6.6 g/dL (ref 6.0–8.3)

## 2024-03-12 LAB — T4, FREE: Free T4: 0.83 ng/dL (ref 0.60–1.60)

## 2024-03-12 LAB — TSH: TSH: 3.86 u[IU]/mL (ref 0.35–5.50)

## 2024-03-12 LAB — CBC
HCT: 41.1 % (ref 36.0–46.0)
Hemoglobin: 13.8 g/dL (ref 12.0–15.0)
MCHC: 33.6 g/dL (ref 30.0–36.0)
MCV: 92.7 fl (ref 78.0–100.0)
Platelets: 217 10*3/uL (ref 150.0–400.0)
RBC: 4.44 Mil/uL (ref 3.87–5.11)
RDW: 12.4 % (ref 11.5–15.5)
WBC: 6.5 10*3/uL (ref 4.0–10.5)

## 2024-03-12 NOTE — Assessment & Plan Note (Signed)
 Generally healthy Discussed doing exercise again Again discussed colon cancer screening--she has FIT---urged her to do it Yearly mammogram Needs last pap at gyn Flu/COVID vaccines in the fall Consider shingrix

## 2024-03-12 NOTE — Assessment & Plan Note (Signed)
 Urged her to stop smoking No major symptoms at this point

## 2024-03-12 NOTE — Assessment & Plan Note (Signed)
 Ongoing anxiety Has not done well with medication--is on nortripyline 20mg  bedtime

## 2024-03-12 NOTE — Assessment & Plan Note (Signed)
 Seems euthyroid on levothyroxine 75 mcg dialy Will check labs

## 2024-03-12 NOTE — Assessment & Plan Note (Signed)
Getting injections
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
## 2024-04-07 ENCOUNTER — Ambulatory Visit

## 2024-04-07 DIAGNOSIS — E538 Deficiency of other specified B group vitamins: Secondary | ICD-10-CM

## 2024-04-07 MED ORDER — CYANOCOBALAMIN 1000 MCG/ML IJ SOLN
1000.0000 ug | Freq: Once | INTRAMUSCULAR | Status: AC
  Administered 2024-04-07: 1000 ug via INTRAMUSCULAR

## 2024-04-07 NOTE — Progress Notes (Signed)
 Per orders of Winthrop Hawks, NP , injection of B-12 given by Eller Gut in left deltoid. Patient tolerated injection well. Patient will make appointment for 1 month.

## 2024-04-28 DIAGNOSIS — G43019 Migraine without aura, intractable, without status migrainosus: Secondary | ICD-10-CM | POA: Diagnosis not present

## 2024-05-11 ENCOUNTER — Ambulatory Visit

## 2024-05-11 DIAGNOSIS — E538 Deficiency of other specified B group vitamins: Secondary | ICD-10-CM

## 2024-05-11 MED ORDER — CYANOCOBALAMIN 1000 MCG/ML IJ SOLN
1000.0000 ug | Freq: Once | INTRAMUSCULAR | Status: AC
  Administered 2024-05-11: 1000 ug via INTRAMUSCULAR

## 2024-05-11 NOTE — Progress Notes (Signed)
 Per orders of Dr. Curt Dover, injection of B-12 given by Doretha Ganja in right deltoid. Patient tolerated injection well. Patient will make appointment for 1 month.

## 2024-05-31 DIAGNOSIS — L570 Actinic keratosis: Secondary | ICD-10-CM | POA: Diagnosis not present

## 2024-05-31 DIAGNOSIS — D2371 Other benign neoplasm of skin of right lower limb, including hip: Secondary | ICD-10-CM | POA: Diagnosis not present

## 2024-05-31 DIAGNOSIS — Z08 Encounter for follow-up examination after completed treatment for malignant neoplasm: Secondary | ICD-10-CM | POA: Diagnosis not present

## 2024-05-31 DIAGNOSIS — D485 Neoplasm of uncertain behavior of skin: Secondary | ICD-10-CM | POA: Diagnosis not present

## 2024-05-31 DIAGNOSIS — L565 Disseminated superficial actinic porokeratosis (DSAP): Secondary | ICD-10-CM | POA: Diagnosis not present

## 2024-05-31 DIAGNOSIS — Z85828 Personal history of other malignant neoplasm of skin: Secondary | ICD-10-CM | POA: Diagnosis not present

## 2024-05-31 DIAGNOSIS — D0461 Carcinoma in situ of skin of right upper limb, including shoulder: Secondary | ICD-10-CM | POA: Diagnosis not present

## 2024-05-31 DIAGNOSIS — D235 Other benign neoplasm of skin of trunk: Secondary | ICD-10-CM | POA: Diagnosis not present

## 2024-05-31 DIAGNOSIS — L538 Other specified erythematous conditions: Secondary | ICD-10-CM | POA: Diagnosis not present

## 2024-05-31 DIAGNOSIS — L820 Inflamed seborrheic keratosis: Secondary | ICD-10-CM | POA: Diagnosis not present

## 2024-05-31 DIAGNOSIS — R208 Other disturbances of skin sensation: Secondary | ICD-10-CM | POA: Diagnosis not present

## 2024-06-09 ENCOUNTER — Ambulatory Visit

## 2024-07-01 ENCOUNTER — Other Ambulatory Visit: Payer: Self-pay | Admitting: Internal Medicine

## 2024-07-08 DIAGNOSIS — Z01419 Encounter for gynecological examination (general) (routine) without abnormal findings: Secondary | ICD-10-CM | POA: Diagnosis not present

## 2024-07-08 DIAGNOSIS — Z1231 Encounter for screening mammogram for malignant neoplasm of breast: Secondary | ICD-10-CM | POA: Diagnosis not present

## 2024-07-08 DIAGNOSIS — Z681 Body mass index (BMI) 19 or less, adult: Secondary | ICD-10-CM | POA: Diagnosis not present

## 2024-07-20 DIAGNOSIS — D0461 Carcinoma in situ of skin of right upper limb, including shoulder: Secondary | ICD-10-CM | POA: Diagnosis not present

## 2024-07-20 DIAGNOSIS — D225 Melanocytic nevi of trunk: Secondary | ICD-10-CM | POA: Diagnosis not present

## 2024-08-19 ENCOUNTER — Telehealth: Payer: Self-pay

## 2024-08-19 MED ORDER — LEVOTHYROXINE SODIUM 75 MCG PO TABS: 75.0000 ug | ORAL_TABLET | Freq: Every day | ORAL | 1 refills | Status: AC

## 2024-08-19 NOTE — Telephone Encounter (Signed)
 Rx sent electronically.

## 2024-09-02 ENCOUNTER — Telehealth: Payer: Self-pay | Admitting: *Deleted

## 2024-09-02 ENCOUNTER — Ambulatory Visit (INDEPENDENT_AMBULATORY_CARE_PROVIDER_SITE_OTHER): Admitting: *Deleted

## 2024-09-02 DIAGNOSIS — Z23 Encounter for immunization: Secondary | ICD-10-CM

## 2024-09-02 NOTE — Telephone Encounter (Signed)
 Pt came in and I advised her due to her not having a B12 lab in a year, and last inj was in June 2025. I advised pt that she needs labs and maybe an OV to discuss restarting b12 shots. Pt said she is afraid of needle blood draws and Dr. Jimmy didn't make her get B12 labs and he would just keep letting her get the shots. Pt advised this is something she would have to discuss with provider and advised her to schedule an appt with Dr. Bennett to discuss b12 shots. Pt declined appt and said she would think about scheduling an appt but will just get flu shot now. FYI to Dr. Bennett

## 2024-09-02 NOTE — Telephone Encounter (Signed)
 Copied from CRM [id number]. Topic: General - Other >> Sep 02, 2024  2:56 PM Thersia C wrote: Reason for CRM: Patient called in regarding missed call, I explained to her per note that she can still get her flu shot but will not be able to get her b12 shot until she is scheduled with an provider and get b12 labs since her last one was last year, patient stated she isnt sure why she needs labs and that nothing has change. Stated she will be talking to the nurse once she gets there for her flu shot

## 2024-09-02 NOTE — Telephone Encounter (Signed)
 I had Joellen (team lead) review chart also and she advised me what to do.  Pt hasn't had a B12 inj since 05/11/24, pt no-showed her B12 inj in July and hasn't had one since, also pt hasn't had a B12 lab since 09/04/23. Per Joellen we can do flu shot if pt comes but we are not able to do a B12 inj. Pt needs to f/u with a provider here to get labs checked and decide if B12 injs are still appropriate. Pt's PCP has retired and she doesn't have a TOC until next year so pt needs a f/u with a provider to discuss B12 inj.  Called pt and no answer so left VM requesting pt to call the office back. We can still do flu shot if pt wants to still come. If pt calls back please advise of prev comments and schedule a f/u with an available provider, or Dr. Bennett since she will [facility] with her next year

## 2024-11-02 DIAGNOSIS — G43019 Migraine without aura, intractable, without status migrainosus: Secondary | ICD-10-CM | POA: Diagnosis not present

## 2025-03-14 ENCOUNTER — Encounter
